# Patient Record
Sex: Male | Born: 1955 | Race: White | Hispanic: No | Marital: Married | Smoking: Never smoker
Health system: Southern US, Community
[De-identification: ages and names within clinical notes are randomized; demographics above are authoritative.]

## PROBLEM LIST (undated history)

## (undated) DIAGNOSIS — I10 Essential (primary) hypertension: Secondary | ICD-10-CM

## (undated) DIAGNOSIS — Z9889 Other specified postprocedural states: Secondary | ICD-10-CM

## (undated) DIAGNOSIS — I341 Nonrheumatic mitral (valve) prolapse: Secondary | ICD-10-CM

## (undated) DIAGNOSIS — I340 Nonrheumatic mitral (valve) insufficiency: Secondary | ICD-10-CM

## (undated) DIAGNOSIS — R0602 Shortness of breath: Secondary | ICD-10-CM

## (undated) DIAGNOSIS — E785 Hyperlipidemia, unspecified: Secondary | ICD-10-CM

## (undated) DIAGNOSIS — F419 Anxiety disorder, unspecified: Secondary | ICD-10-CM

## (undated) DIAGNOSIS — E039 Hypothyroidism, unspecified: Secondary | ICD-10-CM

## (undated) DIAGNOSIS — R011 Cardiac murmur, unspecified: Secondary | ICD-10-CM

## (undated) DIAGNOSIS — F329 Major depressive disorder, single episode, unspecified: Secondary | ICD-10-CM

## (undated) DIAGNOSIS — F32A Depression, unspecified: Secondary | ICD-10-CM

## (undated) DIAGNOSIS — K219 Gastro-esophageal reflux disease without esophagitis: Secondary | ICD-10-CM

## (undated) DIAGNOSIS — I4892 Unspecified atrial flutter: Secondary | ICD-10-CM

## (undated) HISTORY — DX: Unspecified atrial flutter: I48.92

## (undated) HISTORY — PX: CARDIAC CATHETERIZATION: SHX172

## (undated) HISTORY — DX: Nonrheumatic mitral (valve) insufficiency: I34.0

## (undated) HISTORY — PX: TONSILLECTOMY: SUR1361

## (undated) HISTORY — PX: INGUINAL HERNIA REPAIR: SHX194

## (undated) HISTORY — DX: Nonrheumatic mitral (valve) prolapse: I34.1

## (undated) HISTORY — PX: HERNIA REPAIR: SHX51

## (undated) SURGERY — ECHOCARDIOGRAM, TRANSESOPHAGEAL
Anesthesia: Moderate Sedation

---

## 2012-01-03 ENCOUNTER — Other Ambulatory Visit: Payer: Self-pay | Admitting: Cardiovascular Disease

## 2012-01-12 ENCOUNTER — Ambulatory Visit (HOSPITAL_COMMUNITY)
Admission: RE | Admit: 2012-01-12 | Discharge: 2012-01-12 | Disposition: A | Discharge status: Home / Self Care | Payer: BC Managed Care – PPO | Source: Ambulatory Visit | Attending: Cardiovascular Disease | Admitting: Cardiovascular Disease

## 2012-01-12 ENCOUNTER — Encounter (HOSPITAL_COMMUNITY)
Admission: RE | Disposition: A | Discharge status: Home / Self Care | Payer: Self-pay | Source: Ambulatory Visit | Attending: Cardiovascular Disease

## 2012-01-12 ENCOUNTER — Encounter (HOSPITAL_COMMUNITY): Payer: Self-pay | Admitting: *Deleted

## 2012-01-12 DIAGNOSIS — I059 Rheumatic mitral valve disease, unspecified: Secondary | ICD-10-CM | POA: Insufficient documentation

## 2012-01-12 HISTORY — DX: Shortness of breath: R06.02

## 2012-01-12 HISTORY — DX: Hypothyroidism, unspecified: E03.9

## 2012-01-12 HISTORY — DX: Nonrheumatic mitral (valve) prolapse: I34.1

## 2012-01-12 HISTORY — DX: Essential (primary) hypertension: I10

## 2012-01-12 HISTORY — PX: TEE WITHOUT CARDIOVERSION: SHX5443

## 2012-01-12 SURGERY — ECHOCARDIOGRAM, TRANSESOPHAGEAL
Anesthesia: Moderate Sedation

## 2012-01-12 MED ORDER — FENTANYL CITRATE 0.05 MG/ML IJ SOLN
INTRAMUSCULAR | Status: AC
  Filled 2012-01-12: qty 2

## 2012-01-12 MED ORDER — SODIUM CHLORIDE 0.9 % IJ SOLN: 3.0000 mL | Freq: Two times a day (BID) | INTRAMUSCULAR | Status: DC

## 2012-01-12 MED ORDER — MIDAZOLAM HCL 10 MG/2ML IJ SOLN
INTRAMUSCULAR | Status: DC | PRN
  Administered 2012-01-12 (×3): 2 mg via INTRAVENOUS

## 2012-01-12 MED ORDER — SODIUM CHLORIDE 0.45 % IV SOLN
INTRAVENOUS | Status: DC
  Administered 2012-01-12: 12:00:00 via INTRAVENOUS

## 2012-01-12 MED ORDER — FENTANYL CITRATE 0.05 MG/ML IJ SOLN: 250.0000 ug | Freq: Once | INTRAMUSCULAR | Status: DC

## 2012-01-12 MED ORDER — SODIUM CHLORIDE 0.9 % IJ SOLN: 3.0000 mL | INTRAMUSCULAR | Status: DC | PRN

## 2012-01-12 MED ORDER — SODIUM CHLORIDE 0.9 % IV SOLN: 250.0000 mL | INTRAVENOUS | Status: DC | PRN

## 2012-01-12 MED ORDER — FENTANYL CITRATE 0.05 MG/ML IJ SOLN
INTRAMUSCULAR | Status: DC | PRN
  Administered 2012-01-12 (×2): 25 ug via INTRAVENOUS

## 2012-01-12 MED ORDER — BENZOCAINE 20 % MT SOLN: 1.0000 "application " | OROMUCOSAL | Status: DC | PRN

## 2012-01-12 MED ORDER — MIDAZOLAM HCL 10 MG/2ML IJ SOLN: 10.0000 mg | Freq: Once | INTRAMUSCULAR | Status: DC

## 2012-01-12 MED ORDER — BUTAMBEN-TETRACAINE-BENZOCAINE 2-2-14 % EX AERO
INHALATION_SPRAY | CUTANEOUS | Status: DC | PRN
  Administered 2012-01-12: 2 via TOPICAL

## 2012-01-12 MED ORDER — MIDAZOLAM HCL 10 MG/2ML IJ SOLN
INTRAMUSCULAR | Status: AC
  Filled 2012-01-12: qty 2

## 2012-01-12 NOTE — CV Procedure (Signed)
INDICATIONS: mitral insufficiency  PROCEDURE:   Informed consent was obtained prior to the procedure. The risks, benefits and alternatives for the procedure were discussed and the patient comprehended these risks.  Risks include, but are not limited to, cough, sore throat, vomiting, nausea, somnolence, esophageal and stomach trauma or perforation, bleeding, low blood pressure, aspiration, pneumonia, infection, trauma to the teeth and death.    After a procedural time-out, the oropharynx was anesthetized with 20% benzocaine spray. The patient was given 7 mg versed and 50 mcg fentanyl for moderate sedation.   The transesophageal probe was inserted in the esophagus and stomach without difficulty and multiple views were obtained.  The patient was kept under observation until the patient left the procedure room.  The patient left the procedure room in stable condition.   Agitated microbubble saline contrast was administered.  COMPLICATIONS:    There were no immediate complications.  FINDINGS:  Normal left ventricular systolic function. Moderately dilated left atrium. 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 Moderate to severe mitral insufficiency with late systolic flow reversal in the right pulmonary veins. Otherwise normal study.  RECOMMENDATIONS:   Consider surgical repair of the mitral valve.  Time Spent Directly with the Patient:  45  minutes   Nelva Hauk 01/12/2012, 12:20 PM

## 2012-01-12 NOTE — Progress Notes (Signed)
  Echocardiogram Echocardiogram Transesophageal has been performed.  Matthew Mclean [place] 01/12/2012, 1:08 PM

## 2012-01-12 NOTE — H&P (Signed)
Date of Initial H&P: 01/03/2012  History reviewed, patient examined, no change in status, stable for TEE. TEE for MVP and MR preop. Thurmon Fair, MD, [facility] [phone] office [phone] pager 01/12/2012 1130h.

## 2012-01-12 NOTE — Discharge Instructions (Signed)
Transesophageal Echocardiography A transesophageal echocardiogram (TEE) is a special type of test that produces images of the heart by sound waves (echocardiogram). This type of echocardiogram can obtain better images of the heart than a standard echocardiogram. A TEE is done by passing a flexible tube down the esophagus. The heart is located in front of the esophagus. Because the heart and esophagus are close to one another, your caregiver can take very clear, detailed pictures of the heart via ultrasound waves. WHY HAVE A TEE? Your caregiver may need more information based on your medical condition. A TEE is usually performed due to the following:  Your caregiver needs more information based on standard echocardiogram findings.   If you had a stroke, this might have happened because a clot formed in your heart. A TEE can visualize different areas of the heart and check for clots.   To check valve anatomy and function. Your caregiver will especially look at the mitral valve.   To check for redness, soreness, and swelling (inflammation) on the inside lining of the heart (endocarditis).   To evaluate the dividing wall (septum) of the heart and presence of a hole that did not close after birth (patent foramen ovale, PFO).   To help diagnose a tear in the wall of the aorta (aortic dissection).   During cardiac valve surgery, a TEE probe is placed. This allows the surgeon to assess the valve repair before closing the chest.  LET YOUR CAREGIVER KNOW ABOUT:   Swallowing difficulties.   An esophageal obstruction.   Use of aspirin or antiplatelet therapy.  RISKS AND COMPLICATIONS  Though extremely rare, an esophageal tear (rupture) is a potential complication. BEFORE THE PROCEDURE   Arrive at least 1 hour before the procedure or as told by your caregiver.   Do not eat or drink for 6 hours before the procedure or as told by your caregiver.   An intravenous (IV) access tube will be started in  the arm.  PROCEDURE   A medicine to help you relax (sedative) will be given through the IV.   A medicine that numbs the area (local anesthetic) may be sprayed to the back of the throat.   Your blood pressure, heart rate, and breathing (vital signs) will be monitored during the procedure.   The TEE probe is a long, flexible tube. It is about the width of an adult male's index finger. The tip of the probe is placed into the back of the mouth and you will be asked to swallow. This helps to pass the tip of the probe into the esophagus. Once the tip of the probe is in the correct area, your caregiver can take pictures of the heart.   A TEE is usually not a painful procedure. You may feel the probe press against the back of the throat. The probe does not enter the trachea and does not affect your breathing.   Your time spent at the hospital is usually less than 2 hours.  AFTER THE PROCEDURE   You will be in bed, resting until you have fully returned to consciousness.   When you first awaken, your throat may feel slightly sore and will probably still feel numb. This will improve slowly over time.   You will not be allowed to eat or drink until it is clear that numbness has improved.   Once you have been able to drink, urinate, and sit on the edge of the bed without feeling sick to your stomach (nauseous)   or dizzy, you may be cleared to dress and go home.   Do not drive yourself home. You have had medications that can continue to make you feel drowsy and can impair your reflexes.   You should have a friend or family member with you for the next 24 hours after your examination.  Obtaining the test results It is your responsibility to obtain your test results. Ask the lab or department performing the test when and how you will get your results. SEEK IMMEDIATE MEDICAL CARE IF:   There is chest pain.   You have a hard time breathing or have shortness of breath.   You cough or throw up (vomit)  blood.  MAKE SURE YOU:   Understand these instructions.   Will watch this condition.   Will get help right away if you is not doing well or gets worse.  Document Released: 12/24/2002 Document Revised: 09/22/2011 Document Reviewed: 03/17/2009 [employer] Patient Information 2012 [employer]. 

## 2012-01-12 NOTE — Op Note (Signed)
Fooks,Kota S Male, 56 y.o., [birth year]  MRN: [id number]     TEE REPORT  INDICATIONS:  mitral insufficiency  PROCEDURE:  Informed consent was obtained prior to the procedure. The risks, benefits and alternatives for the procedure were discussed and the patient comprehended these risks. Risks include, but are not limited to, cough, sore throat, vomiting, nausea, somnolence, esophageal and stomach trauma or perforation, bleeding, low blood pressure, aspiration, pneumonia, infection, trauma to the teeth and death.  After a procedural time-out, the oropharynx was anesthetized with 20% benzocaine spray. The patient was given 7 mg versed and 50 mcg fentanyl for moderate sedation. The transesophageal probe was inserted in the esophagus and stomach without difficulty and multiple views were obtained. The patient was kept under observation until the patient left the procedure room. The patient left the procedure room in stable condition.  Agitated microbubble saline contrast was administered.  COMPLICATIONS:  There were no immediate complications.  FINDINGS:  Normal left ventricular systolic function.  Moderately dilated left atrium.  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  Moderate to severe mitral insufficiency with late systolic flow reversal in the right pulmonary veins.  Otherwise normal study.  RECOMMENDATIONS:  Consider surgical repair of the mitral valve.  Time Spent Directly with the Patient:  45  minutes  Claron Rosencrans  01/12/2012, 12:20 PM

## 2012-01-16 ENCOUNTER — Encounter (HOSPITAL_COMMUNITY): Payer: Self-pay | Admitting: Cardiovascular Disease

## 2012-01-18 ENCOUNTER — Encounter: Payer: Self-pay | Admitting: Thoracic Surgery (Cardiothoracic Vascular Surgery)

## 2012-01-20 ENCOUNTER — Encounter: Payer: Self-pay | Admitting: Thoracic Surgery (Cardiothoracic Vascular Surgery)

## 2012-01-20 DIAGNOSIS — I341 Nonrheumatic mitral (valve) prolapse: Secondary | ICD-10-CM | POA: Insufficient documentation

## 2012-01-20 DIAGNOSIS — E039 Hypothyroidism, unspecified: Secondary | ICD-10-CM

## 2012-01-20 DIAGNOSIS — R0602 Shortness of breath: Secondary | ICD-10-CM

## 2012-01-20 DIAGNOSIS — I10 Essential (primary) hypertension: Secondary | ICD-10-CM

## 2012-01-23 ENCOUNTER — Institutional Professional Consult (permissible substitution) (INDEPENDENT_AMBULATORY_CARE_PROVIDER_SITE_OTHER): Payer: BC Managed Care – PPO | Admitting: Thoracic Surgery (Cardiothoracic Vascular Surgery)

## 2012-01-23 ENCOUNTER — Other Ambulatory Visit: Payer: Self-pay

## 2012-01-23 ENCOUNTER — Encounter (HOSPITAL_COMMUNITY): Payer: Self-pay | Admitting: Pharmacy Technician

## 2012-01-23 ENCOUNTER — Other Ambulatory Visit: Payer: Self-pay | Admitting: Thoracic Surgery (Cardiothoracic Vascular Surgery)

## 2012-01-23 ENCOUNTER — Encounter: Payer: Self-pay | Admitting: Thoracic Surgery (Cardiothoracic Vascular Surgery)

## 2012-01-23 VITALS — BP 121/74 | HR 56 | Temp 98.2°F | Resp 16 | Ht 71.5 in | Wt 201.0 lb

## 2012-01-23 DIAGNOSIS — I340 Nonrheumatic mitral (valve) insufficiency: Secondary | ICD-10-CM

## 2012-01-23 DIAGNOSIS — I341 Nonrheumatic mitral (valve) prolapse: Secondary | ICD-10-CM

## 2012-01-23 DIAGNOSIS — I059 Rheumatic mitral valve disease, unspecified: Secondary | ICD-10-CM

## 2012-01-23 NOTE — Progress Notes (Signed)
[address]            [name] [address]          [phone]     CARDIOTHORACIC SURGERY CONSULTATION REPORT  PCP is [facility], MD, MD Referring Provider is Nanetta Batty, MD  Chief Complaint  Patient presents with  . Mitral Valve Prolapse    Referral from Dr Allyson Sabal for poss MV repair, TEE 01/12/12  . 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 The patient states that his symptoms have been vague in onset and severity, but he has definitely noticed a tendency towards worsening fatigue. He used to exercise quite regularly and he had to quit running do to severe fatigue and exertional shortness of breath. He was noted to have a heart murmur on physical exam by his primary care physician and an echocardiogram was performed demonstrating mitral valve prolapse with mitral regurgitation. The patient was referred to Dr. Allyson Sabal and subsequently underwent transesophageal echocardiogram confirming the presence of mitral valve prolapse with severe mitral regurgitation. The patient has now been referred to consider elective surgical intervention.  Past Medical History  Diagnosis Date  . Hypertension   . Shortness of breath     on exertion  . Hypothyroidism   . MVP (mitral valve prolapse)   . 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 Hernia repair     inguinal x 2  . Tee without cardioversion 01/12/2012    Procedure: TRANSESOPHAGEAL ECHOCARDIOGRAM (TEE);  Surgeon: Thurmon Fair, MD;  Location: [facility] ENDOSCOPY;  Service: Cardiovascular;  Laterality: N/A;  3d echo    Family History  Problem Relation Age of Onset  . Cancer Paternal Grandmother     Social History History  Substance Use Topics  . Smoking status: Never Smoker   . Smokeless  tobacco: Never Used  . Alcohol Use: No    Current Outpatient Prescriptions  Medication Sig Dispense Refill  . aspirin 325 MG tablet Take 325 mg by mouth daily.      . chlorpheniramine (CHLOR-TRIMETON) 4 MG tablet Take 4 mg by mouth 2 (two) times daily as needed.      . DULoxetine (CYMBALTA) 60 MG capsule Take 60 mg by mouth daily.      Marland Kitchen ibuprofen (ADVIL,MOTRIN) 200 MG tablet Take 400 mg by mouth 2 (two) times daily.      Marland Kitchen levothyroxine (SYNTHROID, LEVOTHROID) 125 MCG tablet Take 125 mcg by mouth daily.      . nebivolol (BYSTOLIC) 5 MG tablet Take 5 mg by mouth daily.      . valsartan (DIOVAN) 320 MG tablet Take 320 mg by mouth daily.        Allergies  Allergen Reactions  . Penicillins Other (See Comments)    Unknown reaction.  Mother told him he was allergic.    Review of Systems:  General:  normal appetite, decreased energy   Respiratory:  no cough, no wheezing, no hemoptysis, no pain with inspiration or cough, + mild exertional shortness of breath  Cardiac:  + intermittent chest pain or tightness typically with stress or exertion, + exertional SOB, no resting SOB, no PND, no orthopnea, no LE edema, no palpitations, no syncope  GI:   no difficulty swallowing, no hematochezia, no hematemesis, no melena, + constipation, no diarrhea   GU:   no dysuria, no urgency, no frequency   Musculoskeletal: no arthritis, mild arthralgia both knees and recently in both feet  Vascular:  no pain suggestive of claudication   Neuro:   no symptoms suggestive of TIA's, no seizures, no headaches, no peripheral neuropathy   Endocrine:  Negative   HEENT:  no loose teeth or painful teeth, goes to his dentist regularly, no recent vision changes  Psych:   + anxiety, no depression    Physical Exam:   BP 121/74  Pulse 56  Temp 98.2 F (36.8 C)  Resp 16  Ht 5' 11.5" (1.816 m)  Wt 201 lb (91.173 kg)  BMI 27.64 kg/m2  SpO2 98%  General:    well-appearing  HEENT:  Unremarkable   Neck:   no JVD, no  bruits, no adenopathy   Chest:   clear to auscultation, symmetrical breath sounds, no wheezes, no rhonchi   CV:   RRR, grade III/VI systolic murmur   Abdomen:  soft, non-tender, no masses   Extremities:  warm, well-perfused, pulses palpable  Rectal/GU  Deferred  Neuro:   Grossly non-focal and symmetrical throughout  Skin:   Clean and dry, no rashes, no breakdown   Diagnostic Tests:  Transesophageal Echocardiography  Patient: Matthew Mclean, [place] MR #: [id number] Study Date: 01/12/2012 ------------------------------------------------------------ LV EF: 55% - 60% ------------------------------------------------------------ Indications: Mitral regurgitation 424.0. ------------------------------------------------------------ Study Conclusions - Left ventricle: The cavity size was at the upper limits of normal. Wall thickness was normal. Systolic function was normal. The estimated ejection fraction was in the range of 55% to 60%. Wall motion was normal; there were no regional wall motion abnormalities. - Aortic valve: No evidence of vegetation. - Mitral valve: Mildly dilated annulus. Moderately thickened leaflets . Moderate myxomatous degeneration. Severe, holosystolicprolapse, involving the medial and middle scallop of the posterior leaflet. The maximum prolapse dimension was 10mm. Moderate to severe regurgitation directed anteriorly. - Left atrium: The atrium was dilated. No evidence of thrombus in the atrial cavity or appendage. No evidence of thrombus in the atrial cavity or appendage. - Right atrium: No evidence of thrombus in the atrial cavity or appendage. No evidence of thrombus in the atrial cavity or appendage. - Atrial septum: No defect or patent foramen ovale was identified. Echo contrast study showed no right-to-left atrial level shunt, following an increase in RA pressure induced by provocative maneuvers. Echo contrast study showed no right-to-left atrial level shunt,  at baseline or with provocation. - Tricuspid valve: No evidence of vegetation. - Pulmonic valve: No evidence of vegetation. ------------------------------------------------------------ Left ventricle: The cavity size was at the upper limits of normal. Wall thickness was normal. Systolic function was normal. The estimated ejection fraction was in the range of 55% to 60%. Wall motion was normal; there were no regional wall motion abnormalities. ------------------------------------------------------------ Aortic valve: Structurally normal valve. Trileaflet; normal thickness leaflets. Cusp separation was normal. No evidence of vegetation. Doppler: No regurgitation. ------------------------------------------------------------ Aorta: The aorta was normal, not dilated, and non-diseased. There was no atheroma. There was no evidence for dissection. Aortic root: The aortic root was not dilated. Ascending aorta: The ascending aorta was normal in size. Aortic arch: The aortic arch was normal in size. Descending aorta: The descending aorta was normal in size. ------------------------------------------------------------ Mitral valve: Mildly dilated annulus. Moderately thickened leaflets . Moderate myxomatous degeneration. Leaflet separation was normal. Severe, holosystolicprolapse, involving the medial and middle scallop of  the posterior leaflet. The maximum prolapse dimension was 10mm. Doppler: Moderate to severe regurgitation directed anteriorly. ------------------------------------------------------------ Left atrium: The atrium was dilated. No evidence of thrombus in the atrial cavity or appendage. No evidence of thrombus in the atrial cavity or appendage. The appendage was morphologically a left appendage, multilobulated, and of normal size. Emptying velocity was normal. ------------------------------------------------------------ Atrial septum: No defect or patent foramen ovale  was identified. Echo contrast study showed no right-to-left atrial level shunt, following an increase in RA pressure induced by provocative maneuvers. Echo contrast study showed no right-to-left atrial level shunt, at baseline or with provocation. ------------------------------------------------------------ Right ventricle: The cavity size was normal. Wall thickness was normal. Systolic function was normal. ------------------------------------------------------------ Pulmonic valve: Structurally normal valve. Cusp separation was normal. No evidence of vegetation. ------------------------------------------------------------ Tricuspid valve: Structurally normal valve. Leaflet separation was normal. No evidence of vegetation. Doppler: No regurgitation. ------------------------------------------------------------ Pulmonary artery: The main pulmonary artery was normal-sized. ------------------------------------------------------------ Right atrium: The atrium was normal in size. No evidence of thrombus in the atrial cavity or appendage. No evidence of thrombus in the atrial cavity or appendage. The appendage was morphologically a right appendage. ------------------------------------------------------------ Pericardium: There was no pericardial effusion. ------------------------------------------------------------ Post procedure conclusions Ascending Aorta: - The aorta was normal, not dilated, and non-diseased. ------------------------------------------------------------ 2D measurements Normal Doppler measurements Normal Mitral valve Mitral valve Annulus ML 43.34 mm ------ Regurg 32.6 cm/s ------ diam, D alias vel, PISA Regurg 12.8 mm ------ radius, PISA Max regurg 551 cm/s ------ vel Regurg VTI 131 cm ------ ERO, PISA 0.61 cm^2 ------ Regurg 79.8 ml ------ vol, PISA ------------------------------------------------------------ Amended  Croitoru,  Mihai [id number]    Impression:  Mitral valve prolapse with severe, symptomatic mitral regurgitation. The patient has normal left ventricular systolic function and is otherwise healthy. Based upon review of his recent transesophageal echocardiogram, there is a very high likelihood that his valve should be repairable with excellent durable result. The patient appears to be a very good candidate for minimally invasive approach for surgery although he has not yet had heart catheterization performed.   Plan:  The rationale for elective mitral valve repair surgery has been explained, including a comparison between surgery and continued medical therapy with close follow-up.  The likelihood of successful and durable valve repair has been discussed with particular reference to the findings of their recent echocardiogram.  Based upon these findings and previous experience, I have quoted them a greater than 98 percent likelihood of successful valve repair.  In the unlikely event that their valve cannot be successfully repaired, we discussed the possibility of replacing the mitral valve using a mechanical prosthesis with the attendant need for long-term anticoagulation versus the alternative of replacing it using a bioprosthetic tissue valve with its potential for late structural valve deterioration and failure, depending upon the patient's longevity.  Alternative surgical approaches have been discussed, including a comparison between conventional sternotomy and minimally-invasive techniques.  The relative risks and benefits of each have been reviewed as they pertain to the patient's specific circumstances, and all of their questions have been addressed.  We tentatively plan to proceed with surgery on Tuesday, May 7. The patient will contact Dr. Hazle Coca office to schedule elective left and right heart catheterization within the next few weeks. We will ask that the descending thoracic and abdominal  aorta be imaged at the time of catheterization to rule out the presence of aortoiliac occlusive disease.     [name]. Cornelius Moras, MD 01/23/2012 11:26 AM

## 2012-02-01 ENCOUNTER — Other Ambulatory Visit: Payer: Self-pay | Admitting: Cardiovascular Disease

## 2012-02-05 MED ORDER — METOPROLOL TARTRATE 12.5 MG HALF TABLET
12.5000 mg | ORAL_TABLET | Freq: Once | ORAL | Status: DC
  Filled 2012-02-05: qty 1

## 2012-02-05 MED ORDER — VANCOMYCIN HCL 1000 MG IV SOLR
1500.0000 mg | INTRAVENOUS | Status: DC
  Filled 2012-02-05: qty 1500

## 2012-02-05 MED ORDER — EPINEPHRINE HCL 1 MG/ML IJ SOLN
0.5000 ug/min | INTRAVENOUS | Status: DC
  Filled 2012-02-05: qty 4

## 2012-02-05 MED ORDER — PHENYLEPHRINE HCL 10 MG/ML IJ SOLN
30.0000 ug/min | INTRAVENOUS | Status: DC
  Filled 2012-02-05: qty 2

## 2012-02-05 MED ORDER — POTASSIUM CHLORIDE 2 MEQ/ML IV SOLN
80.0000 meq | INTRAVENOUS | Status: DC
  Filled 2012-02-05: qty 40

## 2012-02-05 MED ORDER — MOXIFLOXACIN HCL IN NACL 400 MG/250ML IV SOLN
400.0000 mg | INTRAVENOUS | Status: DC
  Filled 2012-02-05: qty 250

## 2012-02-05 MED ORDER — TRANEXAMIC ACID (OHS) BOLUS VIA INFUSION
15.0000 mg/kg | INTRAVENOUS | Status: DC
  Filled 2012-02-05: qty 1368

## 2012-02-05 MED ORDER — DOPAMINE-DEXTROSE 3.2-5 MG/ML-% IV SOLN: 2.0000 ug/kg/min | INTRAVENOUS | Status: DC

## 2012-02-05 MED ORDER — NITROGLYCERIN IN D5W 200-5 MCG/ML-% IV SOLN: 2.0000 ug/min | INTRAVENOUS | Status: DC

## 2012-02-05 MED ORDER — MAGNESIUM SULFATE 50 % IJ SOLN
40.0000 meq | INTRAMUSCULAR | Status: DC
  Filled 2012-02-05: qty 10

## 2012-02-05 MED ORDER — TRANEXAMIC ACID 100 MG/ML IV SOLN
1.5000 mg/kg/h | INTRAVENOUS | Status: DC
  Filled 2012-02-05: qty 25

## 2012-02-05 MED ORDER — CHLORHEXIDINE GLUCONATE 4 % EX LIQD
30.0000 mL | CUTANEOUS | Status: DC
  Filled 2012-02-05: qty 30

## 2012-02-05 MED ORDER — SODIUM CHLORIDE 0.9 % IV SOLN
INTRAVENOUS | Status: DC
  Filled 2012-02-05: qty 1

## 2012-02-05 MED ORDER — TRANEXAMIC ACID (OHS) PUMP PRIME SOLUTION
2.0000 mg/kg | INTRAVENOUS | Status: DC
  Filled 2012-02-05: qty 1.82

## 2012-02-05 MED ORDER — SODIUM BICARBONATE 8.4 % IV SOLN
INTRAVENOUS | Status: DC
  Filled 2012-02-05: qty 2.5

## 2012-02-05 MED ORDER — SODIUM CHLORIDE 0.9 % IV SOLN
0.1000 ug/kg/h | INTRAVENOUS | Status: DC
  Filled 2012-02-05: qty 4

## 2012-02-06 ENCOUNTER — Encounter (HOSPITAL_COMMUNITY)
Admission: RE | Disposition: A | Discharge status: Home / Self Care | Payer: Self-pay | Source: Ambulatory Visit | Attending: Cardiovascular Disease

## 2012-02-06 ENCOUNTER — Encounter (HOSPITAL_COMMUNITY): Payer: Self-pay | Admitting: Respiratory Therapy

## 2012-02-06 ENCOUNTER — Ambulatory Visit (HOSPITAL_COMMUNITY)
Admission: RE | Admit: 2012-02-06 | Discharge: 2012-02-06 | Disposition: A | Discharge status: Home / Self Care | Payer: BC Managed Care – PPO | Source: Ambulatory Visit | Attending: Cardiovascular Disease | Admitting: Cardiovascular Disease

## 2012-02-06 DIAGNOSIS — I10 Essential (primary) hypertension: Secondary | ICD-10-CM | POA: Insufficient documentation

## 2012-02-06 DIAGNOSIS — I059 Rheumatic mitral valve disease, unspecified: Secondary | ICD-10-CM | POA: Insufficient documentation

## 2012-02-06 DIAGNOSIS — E785 Hyperlipidemia, unspecified: Secondary | ICD-10-CM | POA: Insufficient documentation

## 2012-02-06 HISTORY — PX: LEFT AND RIGHT HEART CATHETERIZATION WITH CORONARY ANGIOGRAM: SHX5449

## 2012-02-06 LAB — POCT I-STAT 3, ART BLOOD GAS (G3+)
Acid-base deficit: 2 mmol/L (ref 0.0–2.0)
Acid-base deficit: 2 mmol/L (ref 0.0–2.0)
Bicarbonate: 22.1 mEq/L (ref 20.0–24.0)
O2 Saturation: 98 %
TCO2: 23 mmol/L (ref 0–100)
pH, Arterial: 7.362 (ref 7.350–7.450)

## 2012-02-06 SURGERY — LEFT AND RIGHT HEART CATHETERIZATION WITH CORONARY ANGIOGRAM
Anesthesia: LOCAL

## 2012-02-06 MED ORDER — DIAZEPAM 5 MG PO TABS
ORAL_TABLET | ORAL | Status: AC
  Filled 2012-02-06: qty 1

## 2012-02-06 MED ORDER — DIAZEPAM 5 MG PO TABS: 5.0000 mg | ORAL_TABLET | ORAL | Status: DC

## 2012-02-06 MED ORDER — LIDOCAINE HCL (PF) 1 % IJ SOLN
INTRAMUSCULAR | Status: AC
  Filled 2012-02-06: qty 30

## 2012-02-06 MED ORDER — ACETAMINOPHEN 325 MG PO TABS: 650.0000 mg | ORAL_TABLET | ORAL | Status: DC | PRN

## 2012-02-06 MED ORDER — ONDANSETRON HCL 4 MG/2ML IJ SOLN: 4.0000 mg | Freq: Four times a day (QID) | INTRAMUSCULAR | Status: DC | PRN

## 2012-02-06 MED ORDER — HEPARIN (PORCINE) IN NACL 2-0.9 UNIT/ML-% IJ SOLN
INTRAMUSCULAR | Status: AC
  Filled 2012-02-06: qty 2000

## 2012-02-06 MED ORDER — NITROGLYCERIN 0.2 MG/ML ON CALL CATH LAB
INTRAVENOUS | Status: AC
  Filled 2012-02-06: qty 1

## 2012-02-06 MED ORDER — SODIUM CHLORIDE 0.9 % IJ SOLN: 3.0000 mL | INTRAMUSCULAR | Status: DC | PRN

## 2012-02-06 MED ORDER — SODIUM CHLORIDE 0.9 % IV SOLN: INTRAVENOUS | Status: AC

## 2012-02-06 MED ORDER — SODIUM CHLORIDE 0.9 % IV SOLN
INTRAVENOUS | Status: DC
  Administered 2012-02-06: 14:00:00 via INTRAVENOUS

## 2012-02-06 NOTE — Progress Notes (Signed)
UP AND WALKED AND TOL WELL LEFT GROIN STABLE NO BLEEDING OR HEMATOMA 

## 2012-02-06 NOTE — Op Note (Signed)
Matthew Mclean is a 55 y.o. male    [id number] LOCATION:  FACILITY: [facility]  PHYSICIAN: Nanetta Batty, M.D. 1956/01/08   DATE OF PROCEDURE:  02/06/2012  DATE OF DISCHARGE:  [facility]  CARDIAC CATHETERIZATION     History obtained from chart review. Mr. 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 He was referred for evaluation of mitral regurgitation. Comes in with hypertension and mild hyperlipidemia. 2-D echo revealed severe MR which was confirmed by 3 TEE performed in the hospital. He presents now for a left heart cath to define his coronary anatomy prior to anticipated invasive mitral valve repair to be performed by Dr. Ashley Mariner.   PROCEDURE DESCRIPTION:    The patient was brought to the second floor  [facility] cath lab in the postabsorptive state. He was not  premedicated . His left groin was prepped and shaved in usual sterile fashion. Xylocaine 1% was used  for local anesthesia. A 5 French sheath was inserted into the left common femoral  artery using standard Seldinger technique. A 7 French sheath was inserted into the left common femoral vein. A balloon tipped dilution Swan-Ganz catheter was in advanced to the chambers obtaining sequential pressures, and Fick and thermodilution cardiac outputs. 5 French right and left Judkins diagnostic catheters along with a 5 French pigtail catheter were used for selective coronary angiography, left ventriculography and thoracic and abdominal aortography. Visipaque was used for the entirety of the case. Retrograde aortic, left ventricular and pullback pressures were recorded.  HEMODYNAMICS:    AO SYSTOLIC/AO DIASTOLIC: 114/74   LV SYSTOLIC/LV DIASTOLIC: 119/15  RA pressure: 16/13  RV pressure: 31/11  Pulmonary artery pressure: 28/17  Pulmonary capillary wedge pressure: A wave 20 V wave 21 mean 17  Cardiac  output by Fick was 4.6 L per minute, and bicarbonate dilution 4 L per minute  ANGIOGRAPHIC RESULTS:   1. Left main; normal  2. LAD; normal 3. Left circumflex; normal.  4. Right coronary artery; dominant and normal 5. Left ventriculography; RAO left ventriculogram was performed using  25 mL of Visipaque dye at 12 mL/second. The overall LVEF estimated  60 % Without wall motion abnormalities  Thoracic and abdominal aortogram: Normal, with normal renal arteries are normal iliac bifurcation  IMPRESSION:Mr. Guevarra has normal coronary arteries, normal left ventricular function, mildly elevated right-sided pressures and normal thoracic aorta. The sheaths were removed and pressure was held on the groin to achieve hemostasis. The patient left the lab in stable condition. He was gently hydrated, discharged home in 4-5 hours and will see back in the office later this week in followup. Dr.Cub Cornelius Moras  Was notified of these results.  Runell Gess MD, [facility] 02/06/2012 3:49 PM

## 2012-02-06 NOTE — H&P (Signed)
H & P will be scanned in.  Pt was reexamined and existing H & P reviewed. No changes found.  Runell Gess, MD [facility] 02/06/2012 3:01 PM

## 2012-02-06 NOTE — Discharge Instructions (Signed)

## 2012-02-13 ENCOUNTER — Ambulatory Visit (INDEPENDENT_AMBULATORY_CARE_PROVIDER_SITE_OTHER): Payer: BC Managed Care – PPO | Admitting: Thoracic Surgery (Cardiothoracic Vascular Surgery)

## 2012-02-13 ENCOUNTER — Encounter: Payer: Self-pay | Admitting: Thoracic Surgery (Cardiothoracic Vascular Surgery)

## 2012-02-13 VITALS — BP 131/68 | HR 80 | Resp 16 | Ht 71.5 in | Wt 195.0 lb

## 2012-02-13 DIAGNOSIS — I059 Rheumatic mitral valve disease, unspecified: Secondary | ICD-10-CM | POA: Insufficient documentation

## 2012-02-13 MED ORDER — AMIODARONE HCL 200 MG PO TABS: 200.0000 mg | ORAL_TABLET | Freq: Two times a day (BID) | ORAL | Status: DC

## 2012-02-13 NOTE — Patient Instructions (Signed)
Stop aspirin and start amiodarone

## 2012-02-13 NOTE — Progress Notes (Signed)
[address]            Matthew Mclean [address]          [phone]     CARDIOTHORACIC SURGERY OFFICE NOTE  Referring Provider is Runell Gess, MD PCP is [facility], MD, MD   HPI:  Patient returns for followup of mitral regurgitation with tentatively plans to proceed with mitral valve repair next week. He underwent cardiac catheterization on 02/06/2012. He returns to the office today to review his cath results and further discuss his upcoming surgery.  He reports no new problems or complaints. His wife could not accompany him to the office today.   Current Outpatient Prescriptions  Medication Sig Dispense Refill  . chlorpheniramine (CHLOR-TRIMETON) 4 MG tablet Take 4 mg by mouth 2 (two) times daily as needed.      Marland Kitchen ibuprofen (ADVIL,MOTRIN) 200 MG tablet Take 200 mg by mouth 2 (two) times daily.       . nebivolol (BYSTOLIC) 5 MG tablet Take 5 mg by mouth daily.      . rosuvastatin (CRESTOR) 10 MG tablet Take 10 mg by mouth daily.      . valsartan (DIOVAN) 320 MG tablet Take 320 mg by mouth daily.      Marland Kitchen amiodarone (PACERONE) 200 MG tablet Take 1 tablet (200 mg total) by mouth 2 (two) times daily. Begin 7 days prior to surgery.  14 tablet  0  . levothyroxine (SYNTHROID, LEVOTHROID) 125 MCG tablet Take 125 mcg by mouth daily.          Physical Exam:   BP 131/68  Pulse 80  Resp 16  Ht 5' 11.5" (1.816 m)  Wt 195 lb (88.451 kg)  BMI 26.82 kg/m2  SpO2 96%  General:  Well-appearing  Chest:   Clear to auscultation  CV:   Regular rate and rhythm with prominent systolic murmur  Incisions:  n/a  Abdomen:  Soft and nontender  Extremities:  Warm and well-perfused    Diagnostic Tests:  CARDIAC CATHETERIZATION   HEMODYNAMICS:  AO SYSTOLIC/AO DIASTOLIC: 114/74  LV SYSTOLIC/LV DIASTOLIC: 119/15  RA pressure: 16/13  RV pressure: 31/11  Pulmonary artery pressure: 28/17  Pulmonary capillary wedge pressure: A wave 20 V wave 21 mean 17    Cardiac output by Fick was 4.6 L per minute, and bicarbonate dilution 4 L per minute  ANGIOGRAPHIC RESULTS:  1. Left main; normal  2. LAD; normal  3. Left circumflex; normal.  4. Right coronary artery; dominant and normal  5. Left ventriculography; RAO left ventriculogram was performed using  25 mL of Visipaque dye at 12 mL/second. The overall LVEF estimated  60 % Without wall motion abnormalities  Thoracic and abdominal aortogram: Normal, with normal renal arteries are normal iliac bifurcation  IMPRESSION:Matthew Mclean has normal coronary arteries, normal left ventricular function, mildly elevated right-sided pressures and normal thoracic aorta. The sheaths were removed and pressure was held on the groin to achieve hemostasis. The patient left the lab in stable condition. He was gently hydrated, discharged home in 4-5 hours and will see back in the office later this week in followup. Dr.Cub Cornelius Mclean Was notified of these results.  Runell Gess MD, [facility]  02/06/2012  3:49 PM       Impression:  The patient has mitral valve prolapse with severe mitral regurgitation. Cardiac catheterization demonstrates normal coronary artery anatomy with no significant coronary artery  disease. He appears to be an excellent candidate for minimally invasive approach for surgery. We plan to proceed as originally scheduled on Tuesday, May 7.   Plan:  I again reviewed the indications, risks, and potential benefits of surgery with the patient here in the office today. I started him on amiodarone 200 mg by mouth twice daily to take between now and the time of surgery to decrease his risk of perioperative atrial fibrillation. He has been instructed to stop taking aspirin. He understands and accepts all associated risks of surgery and desires to proceed as originally planned.  We spent in excess of 40 minutes discussing expectations for his recovery.   Matthew Decent. Cornelius Moras, MD 02/13/2012 6:46 PM

## 2012-02-14 NOTE — H&P (Signed)
CARDIOTHORACIC SURGERY HISTORY AND PHYSICAL EXAM  PCP is [facility], MD, MD Referring Provider is Nanetta Batty, MD    Chief Complaint   Patient presents with   .  Mitral Valve Prolapse       Referral from Dr Allyson Sabal for poss MV repair, TEE 01/12/12   .  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 The patient states that his symptoms have been vague in onset and severity, but he has definitely noticed a tendency towards worsening fatigue. He used to exercise quite regularly and he had to quit running do to severe fatigue and exertional shortness of breath. He was noted to have a heart murmur on physical exam by his primary care physician and an echocardiogram was performed demonstrating mitral valve prolapse with mitral regurgitation. The patient was referred to Dr. Allyson Sabal and subsequently underwent transesophageal echocardiogram confirming the presence of mitral valve prolapse with severe mitral regurgitation. The patient was referred to consider elective surgical intervention.        Past Medical History  Diagnosis Date  . Hypertension   . Shortness of breath     on exertion  . Hypothyroidism   . MVP (mitral valve prolapse)   . 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 Hernia repair     inguinal x 2  . Tee without cardioversion 01/12/2012    Procedure: TRANSESOPHAGEAL ECHOCARDIOGRAM (TEE);  Surgeon: Thurmon Fair, MD;  Location: [facility];  Service: Cardiovascular;  Laterality: N/A;  3d echo    Family History  Problem Relation Age of Onset  . Cancer Paternal Grandmother     Social History History  Substance Use Topics  . Smoking status: Never Smoker   . Smokeless tobacco: Never Used  . Alcohol Use: No    No current facility-administered  medications for this encounter.   Current Outpatient Prescriptions  Medication Sig Dispense Refill  . chlorpheniramine (CHLOR-TRIMETON) 4 MG tablet Take 4 mg by mouth 2 (two) times daily as needed.      Marland Kitchen ibuprofen (ADVIL,MOTRIN) 200 MG tablet Take 200 mg by mouth 2 (two) times daily.       Marland Kitchen levothyroxine (SYNTHROID, LEVOTHROID) 125 MCG tablet Take 125 mcg by mouth daily.      . nebivolol (BYSTOLIC) 5 MG tablet Take 5 mg by mouth daily.      . valsartan (DIOVAN) 320 MG tablet Take 320 mg by mouth daily.      Marland Kitchen amiodarone (PACERONE) 200 MG tablet Take 1 tablet (200 mg total) by mouth 2 (two) times daily. Begin 7 days prior to surgery.  14 tablet  0  . rosuvastatin (CRESTOR) 10 MG tablet Take 10 mg by mouth daily.        Allergies  Allergen Reactions  . Penicillins Other (See Comments)    Unknown reaction.  Mother told him he was allergic.   Review of Systems:             General:                      normal appetite, decreased energy               Respiratory:  no cough, no wheezing, no hemoptysis, no pain with inspiration or cough, + mild exertional shortness of breath             Cardiac:                      + intermittent chest pain or tightness typically with stress or exertion, + exertional SOB, no resting SOB, no PND, no orthopnea, no LE edema, no palpitations, no syncope             GI:                                no difficulty swallowing, no hematochezia, no hematemesis, no melena, + constipation, no diarrhea               GU:                              no dysuria, no urgency, no frequency               Musculoskeletal:         no arthritis, mild arthralgia both knees and recently in both feet             Vascular:                     no pain suggestive of claudication               Neuro:                         no symptoms suggestive of TIA's, no seizures, no headaches, no peripheral neuropathy               Endocrine:                   Negative                HEENT:                       no loose teeth or painful teeth, goes to his dentist regularly, no recent vision changes             Psych:                         + anxiety, no depression                Physical Exam:              BP 121/74  Pulse 56  Temp 98.2 F (36.8 C)  Resp 16  Ht 5' 11.5" (1.816 m)  Wt 201 lb (91.173 kg)  BMI 27.64 kg/m2  SpO2 98%             General:                        well-appearing             HEENT:                       Unremarkable               Neck:  no JVD, no bruits, no adenopathy               Chest:                         clear to auscultation, symmetrical breath sounds, no wheezes, no rhonchi               CV:                              RRR, grade III/VI systolic murmur               Abdomen:                    soft, non-tender, no masses               Extremities:                 warm, well-perfused, pulses palpable             Rectal/GU                   Deferred             Neuro:                         Grossly non-focal and symmetrical throughout             Skin:                            Clean and dry, no rashes, no breakdown   Diagnostic Tests:  Transesophageal Echocardiography  Patient: Matthew Mclean, Luczak MR #: [id number] Study Date: 01/12/2012 ------------------------------------------------------------ LV EF: 55% - 60% ------------------------------------------------------------ Indications: Mitral regurgitation 424.0. ------------------------------------------------------------ Study Conclusions - Left ventricle: The cavity size was at the upper limits of normal. Wall thickness was normal. Systolic function was normal. The estimated ejection fraction was in the range of 55% to 60%. Wall motion was normal; there were no regional wall motion abnormalities. - Aortic valve: No evidence of vegetation. - Mitral valve: Mildly dilated annulus. Moderately thickened leaflets . Moderate myxomatous  degeneration. Severe, holosystolicprolapse, involving the medial and middle scallop of the posterior leaflet. The maximum prolapse dimension was 10mm. Moderate to severe regurgitation directed anteriorly. - Left atrium: The atrium was dilated. No evidence of thrombus in the atrial cavity or appendage. No evidence of thrombus in the atrial cavity or appendage. - Right atrium: No evidence of thrombus in the atrial cavity or appendage. No evidence of thrombus in the atrial cavity or appendage. - Atrial septum: No defect or patent foramen ovale was identified. Echo contrast study showed no right-to-left atrial level shunt, following an increase in RA pressure induced by provocative maneuvers. Echo contrast study showed no right-to-left atrial level shunt, at baseline or with provocation. - Tricuspid valve: No evidence of vegetation. - Pulmonic valve: No evidence of vegetation. ------------------------------------------------------------ Left ventricle: The cavity size was at the upper limits of normal. Wall thickness was normal. Systolic function was normal. The estimated ejection fraction was in the range of 55% to 60%. Wall motion was normal; there were no regional wall motion abnormalities. ------------------------------------------------------------ Aortic valve: Structurally normal valve. Trileaflet; normal thickness leaflets. Cusp separation was normal. No evidence of vegetation. Doppler: No regurgitation. ------------------------------------------------------------ Aorta: The aorta was normal, not dilated,  and non-diseased. There was no atheroma. There was no evidence for dissection. Aortic root: The aortic root was not dilated. Ascending aorta: The ascending aorta was normal in size. Aortic arch: The aortic arch was normal in size. Descending aorta: The descending aorta was normal in size. ------------------------------------------------------------ Mitral valve: Mildly  dilated annulus. Moderately thickened leaflets . Moderate myxomatous degeneration. Leaflet separation was normal. Severe, holosystolicprolapse, involving the medial and middle scallop of the posterior leaflet. The maximum prolapse dimension was 10mm. Doppler: Moderate to severe regurgitation directed anteriorly. ------------------------------------------------------------ Left atrium: The atrium was dilated. No evidence of thrombus in the atrial cavity or appendage. No evidence of thrombus in the atrial cavity or appendage. The appendage was morphologically a left appendage, multilobulated, and of normal size. Emptying velocity was normal. ------------------------------------------------------------ Atrial septum: No defect or patent foramen ovale was identified. Echo contrast study showed no right-to-left atrial level shunt, following an increase in RA pressure induced by provocative maneuvers. Echo contrast study showed no right-to-left atrial level shunt, at baseline or with provocation. ------------------------------------------------------------ Right ventricle: The cavity size was normal. Wall thickness was normal. Systolic function was normal. ------------------------------------------------------------ Pulmonic valve: Structurally normal valve. Cusp separation was normal. No evidence of vegetation. ------------------------------------------------------------ Tricuspid valve: Structurally normal valve. Leaflet separation was normal. No evidence of vegetation. Doppler: No regurgitation. ------------------------------------------------------------ Pulmonary artery: The main pulmonary artery was normal-sized. ------------------------------------------------------------ Right atrium: The atrium was normal in size. No evidence of thrombus in the atrial cavity or appendage. No evidence of thrombus in the atrial cavity or appendage. The appendage was morphologically a right  appendage. ------------------------------------------------------------ Pericardium: There was no pericardial effusion. ------------------------------------------------------------ Post procedure conclusions Ascending Aorta: - The aorta was normal, not dilated, and non-diseased. ------------------------------------------------------------ 2D measurements Normal Doppler measurements Normal Mitral valve Mitral valve Annulus ML 43.34 mm ------ Regurg 32.6 cm/s ------ diam, D alias vel, PISA Regurg 12.8 mm ------ radius, PISA Max regurg 551 cm/s ------ vel Regurg VTI 131 cm ------ ERO, PISA 0.61 cm^2 ------ Regurg 79.8 ml ------ vol, PISA ------------------------------------------------------------ Amended  Croitoru, Mihai [id number]  CARDIAC CATHETERIZATION    HEMODYNAMICS:  AO SYSTOLIC/AO DIASTOLIC: 114/74  LV SYSTOLIC/LV DIASTOLIC: 119/15  RA pressure: 16/13   RV pressure: 31/11   Pulmonary artery pressure: 28/17   Pulmonary capillary wedge pressure: A wave 20 V wave 21 mean 17   Cardiac output by Fick was 4.6 L per minute, and bicarbonate dilution 4 L per minute   ANGIOGRAPHIC RESULTS:  1. Left main; normal   2. LAD; normal   3. Left circumflex; normal.   4. Right coronary artery; dominant and normal   5. Left ventriculography; RAO left ventriculogram was performed using   25 mL of Visipaque dye at 12 mL/second. The overall LVEF estimated   60 % Without wall motion abnormalities   Thoracic and abdominal aortogram: Normal, with normal renal arteries are normal iliac bifurcation   IMPRESSION:Mr. Caples has normal coronary arteries, normal left ventricular function, mildly elevated right-sided pressures and normal thoracic aorta. The sheaths were removed and pressure was held on the groin to achieve hemostasis. The patient left the lab in stable condition. He was gently hydrated, discharged home in 4-5 hours and will see back in the office later this week in  followup. Dr.Cub Cornelius Moras Was notified of these results.   Runell Gess MD, [facility]   02/06/2012   3:49 PM       Impression:  Mitral valve prolapse with severe, symptomatic mitral regurgitation. The patient has normal left ventricular systolic function  and is otherwise healthy. Based upon review of his recent transesophageal echocardiogram, there is a very high likelihood that his valve should be repairable with excellent durable result.    Plan:  The rationale for elective mitral valve repair surgery has been explained, including a comparison between surgery and continued medical therapy with close follow-up.  The likelihood of successful and durable valve repair has been discussed with particular reference to the findings of their recent echocardiogram.  Based upon these findings and previous experience, I have quoted them a greater than 98 percent likelihood of successful valve repair.  In the unlikely event that their valve cannot be successfully repaired, we discussed the possibility of replacing the mitral valve using a mechanical prosthesis with the attendant need for long-term anticoagulation versus the alternative of replacing it using a bioprosthetic tissue valve with its potential for late structural valve deterioration and failure, depending upon the patient's longevity.  Alternative surgical approaches have been discussed, including a comparison between conventional sternotomy and minimally-invasive techniques.  The relative risks and benefits of each have been reviewed as they pertain to the patient's specific circumstances, and all of their questions have been addressed. We tentatively plan to proceed with surgery on Tuesday, May 7.        [name]. Cornelius Moras, MD

## 2012-02-17 ENCOUNTER — Ambulatory Visit (HOSPITAL_COMMUNITY)
Admission: RE | Admit: 2012-02-17 | Discharge: 2012-02-17 | Disposition: A | Discharge status: Home / Self Care | Payer: BC Managed Care – PPO | Source: Ambulatory Visit | Attending: Thoracic Surgery (Cardiothoracic Vascular Surgery) | Admitting: Thoracic Surgery (Cardiothoracic Vascular Surgery)

## 2012-02-17 ENCOUNTER — Encounter (HOSPITAL_COMMUNITY)
Admission: RE | Admit: 2012-02-17 | Discharge: 2012-02-17 | Disposition: A | Discharge status: Home / Self Care | Payer: BC Managed Care – PPO | Source: Ambulatory Visit | Attending: Thoracic Surgery (Cardiothoracic Vascular Surgery) | Admitting: Thoracic Surgery (Cardiothoracic Vascular Surgery)

## 2012-02-17 ENCOUNTER — Encounter (HOSPITAL_COMMUNITY): Payer: Self-pay

## 2012-02-17 ENCOUNTER — Inpatient Hospital Stay (HOSPITAL_COMMUNITY)
Admission: RE | Admit: 2012-02-17 | Discharge: 2012-02-17 | Disposition: A | Discharge status: Home / Self Care | Payer: BC Managed Care – PPO | Source: Ambulatory Visit | Attending: Thoracic Surgery (Cardiothoracic Vascular Surgery) | Admitting: Thoracic Surgery (Cardiothoracic Vascular Surgery)

## 2012-02-17 VITALS — BP 118/76 | HR 57 | Temp 98.5°F | Resp 20 | Ht 71.0 in | Wt 197.1 lb

## 2012-02-17 DIAGNOSIS — Z01810 Encounter for preprocedural cardiovascular examination: Secondary | ICD-10-CM

## 2012-02-17 DIAGNOSIS — I059 Rheumatic mitral valve disease, unspecified: Secondary | ICD-10-CM | POA: Insufficient documentation

## 2012-02-17 DIAGNOSIS — R0602 Shortness of breath: Secondary | ICD-10-CM | POA: Insufficient documentation

## 2012-02-17 DIAGNOSIS — Z01812 Encounter for preprocedural laboratory examination: Secondary | ICD-10-CM | POA: Insufficient documentation

## 2012-02-17 HISTORY — DX: Gastro-esophageal reflux disease without esophagitis: K21.9

## 2012-02-17 HISTORY — DX: Hyperlipidemia, unspecified: E78.5

## 2012-02-17 HISTORY — DX: Cardiac murmur, unspecified: R01.1

## 2012-02-17 HISTORY — DX: Anxiety disorder, unspecified: F41.9

## 2012-02-17 HISTORY — DX: Major depressive disorder, single episode, unspecified: F32.9

## 2012-02-17 HISTORY — DX: Depression, unspecified: F32.A

## 2012-02-17 LAB — CBC
Hemoglobin: 15.7 g/dL (ref 13.0–17.0)
MCH: 30.4 pg (ref 26.0–34.0)
MCV: 86.3 fL (ref 78.0–100.0)
RBC: 5.17 MIL/uL (ref 4.22–5.81)

## 2012-02-17 LAB — SURGICAL PCR SCREEN
MRSA, PCR: NEGATIVE
Staphylococcus aureus: NEGATIVE

## 2012-02-17 LAB — ABO/RH: ABO/RH(D): O POS

## 2012-02-17 LAB — COMPREHENSIVE METABOLIC PANEL
ALT: 24 U/L (ref 0–53)
CO2: 19 mEq/L (ref 19–32)
Calcium: 9.2 mg/dL (ref 8.4–10.5)
Creatinine, Ser: 0.84 mg/dL (ref 0.50–1.35)
GFR calc Af Amer: >90 mL/min (ref >90)
GFR calc non Af Amer: >90 mL/min (ref >90)
Glucose, Bld: 131 mg/dL — ABNORMAL HIGH (ref 70–99)

## 2012-02-17 LAB — URINALYSIS, ROUTINE W REFLEX MICROSCOPIC
Bilirubin Urine: NEGATIVE
Nitrite: NEGATIVE
Specific Gravity, Urine: 1.018 (ref 1.005–1.030)
pH: 6.5 (ref 5.0–8.0)

## 2012-02-17 LAB — BLOOD GAS, ARTERIAL
Acid-base deficit: 1.5 mmol/L (ref 0.0–2.0)
Bicarbonate: 22.5 mEq/L (ref 20.0–24.0)
FIO2: 0.21 %
O2 Saturation: 97.1 %
pO2, Arterial: 84.9 mmHg (ref 80.0–100.0)

## 2012-02-17 LAB — PROTIME-INR: Prothrombin Time: 13.6 seconds (ref 11.6–15.2)

## 2012-02-17 LAB — TYPE AND SCREEN

## 2012-02-17 NOTE — Pre-Procedure Instructions (Addendum)
20 Matthew Mclean  02/17/2012   Your procedure is scheduled on:  Tuesday, May 7th.  Report to [facility] at 5:30AM.  Call this number if you have problems the morning of surgery: [phone]   Remember:   Do not eat food:After Midnight.  May have clear liquids: up to 4 Hours before arrival.   (1:30am)  Clear liquids include soda, tea, black coffee, apple or grape juice, broth.  Take these medicines the morning of surgery with A SIP OF WATER:  Amiodarone (Pacerone),  Levothyroxine (Synthyroid), Nebivolol (Bystolic).  MAy take Tyloenol and Chlor- Trimeton.  Stop taking Ibuprofen.  Do not take any Aspirin, NSAIDS or herbal Medications.   Do not wear jewelry, make-up or nail polish.  Do not wear lotions, powders, or perfumes. You may wear deodorant.  Do not shave 48 hours prior to surgery.  Do not bring valuables to the hospital.  Contacts, dentures or bridgework may not be worn into surgery.  Leave suitcase in the car. After surgery it may be brought to your room.  For patients admitted to the hospital, checkout time is 11:00 AM the day of discharge.   Patients discharged the day of surgery will not be allowed to drive home.  Name and phone number of your driver: ==  Special Instructions: CHG Shower Use Special Wash: 1/2 bottle night before surgery and 1/2 bottle morning of surgery.   Please read over the following fact sheets that you were given: Pain Booklet, Coughing and Deep Breathing, Blood Transfusion Information, MRSA Information and Surgical Site Infection Prevention  ,Incentive Spirometer and Cardiac Booklet.

## 2012-02-17 NOTE — Progress Notes (Signed)
VASCULAR LAB PRELIMINARY  PRELIMINARY  PRELIMINARY  PRELIMINARY  Pre-op Cardiac Surgery  Carotid Findings:  Bilateral:  No evidence of hemodynamically significant internal carotid artery stenosis.   Vertebral artery flow is antegrade.     Upper Extremity Right Left  Brachial Pressures 117  Triphasic 115 Triphasic  Radial Waveforms Triphasic Triphasic  Ulnar Waveforms Triphasic Triphasic  Palmer Arch Normal Normal   Findings:  Palmer arch evaluation - Doppler waveforms remained normal bilaterally with both radial and ulnar compressions         Maureen Duesing D, RVS 02/17/2012, 2:03 PM

## 2012-02-18 LAB — HEMOGLOBIN A1C: Mean Plasma Glucose: 108 mg/dL (ref <117)

## 2012-02-20 MED ORDER — SODIUM CHLORIDE 0.9 % IV SOLN
INTRAVENOUS | Status: AC
  Administered 2012-02-21: 2.5 [IU]/h via INTRAVENOUS
  Filled 2012-02-20: qty 1

## 2012-02-20 MED ORDER — POTASSIUM CHLORIDE 2 MEQ/ML IV SOLN
80.0000 meq | INTRAVENOUS | Status: DC
  Filled 2012-02-20: qty 40

## 2012-02-20 MED ORDER — NITROGLYCERIN IN D5W 200-5 MCG/ML-% IV SOLN
2.0000 ug/min | INTRAVENOUS | Status: DC
  Filled 2012-02-20: qty 250

## 2012-02-20 MED ORDER — PHENYLEPHRINE HCL 10 MG/ML IJ SOLN
30.0000 ug/min | INTRAVENOUS | Status: AC
  Administered 2012-02-21: 20 ug/min via INTRAVENOUS
  Filled 2012-02-20: qty 2

## 2012-02-20 MED ORDER — TRANEXAMIC ACID (OHS) BOLUS VIA INFUSION
15.0000 mg/kg | INTRAVENOUS | Status: DC
  Filled 2012-02-20: qty 1368

## 2012-02-20 MED ORDER — MAGNESIUM SULFATE 50 % IJ SOLN
40.0000 meq | INTRAMUSCULAR | Status: DC
  Filled 2012-02-20: qty 10

## 2012-02-20 MED ORDER — EPINEPHRINE HCL 1 MG/ML IJ SOLN
0.5000 ug/min | INTRAVENOUS | Status: DC
  Filled 2012-02-20: qty 4

## 2012-02-20 MED ORDER — CHLORHEXIDINE GLUCONATE 4 % EX LIQD: 30.0000 mL | CUTANEOUS | Status: DC

## 2012-02-20 MED ORDER — VANCOMYCIN HCL 1000 MG IV SOLR
1500.0000 mg | INTRAVENOUS | Status: AC
  Administered 2012-02-21: 1500 mg via INTRAVENOUS
  Filled 2012-02-20: qty 1500

## 2012-02-20 MED ORDER — SODIUM CHLORIDE 0.9 % IV SOLN
0.1000 ug/kg/h | INTRAVENOUS | Status: AC
  Administered 2012-02-21: 15:00:00 via INTRAVENOUS
  Administered 2012-02-21: .2 ug/kg/h via INTRAVENOUS
  Filled 2012-02-20: qty 4

## 2012-02-20 MED ORDER — TRANEXAMIC ACID (OHS) PUMP PRIME SOLUTION
2.0000 mg/kg | INTRAVENOUS | Status: DC
  Filled 2012-02-20: qty 1.82

## 2012-02-20 MED ORDER — DOPAMINE-DEXTROSE 3.2-5 MG/ML-% IV SOLN
2.0000 ug/kg/min | INTRAVENOUS | Status: DC
  Filled 2012-02-20: qty 250

## 2012-02-20 MED ORDER — TRANEXAMIC ACID 100 MG/ML IV SOLN
1.5000 mg/kg/h | INTRAVENOUS | Status: AC
  Administered 2012-02-21: 15 mg/kg/h via INTRAVENOUS
  Filled 2012-02-20: qty 25

## 2012-02-20 MED ORDER — PLASMA-LYTE 148 IV SOLN
INTRAVENOUS | Status: DC
  Filled 2012-02-20: qty 2.5

## 2012-02-20 MED ORDER — MOXIFLOXACIN HCL IN NACL 400 MG/250ML IV SOLN
400.0000 mg | INTRAVENOUS | Status: AC
  Administered 2012-02-21: 400 mg via INTRAVENOUS
  Filled 2012-02-20: qty 250

## 2012-02-20 MED ORDER — METOPROLOL TARTRATE 12.5 MG HALF TABLET: 12.5000 mg | ORAL_TABLET | Freq: Once | ORAL | Status: DC

## 2012-02-21 ENCOUNTER — Encounter (HOSPITAL_COMMUNITY): Payer: Self-pay | Admitting: *Deleted

## 2012-02-21 ENCOUNTER — Inpatient Hospital Stay (HOSPITAL_COMMUNITY): Payer: BC Managed Care – PPO

## 2012-02-21 ENCOUNTER — Encounter (HOSPITAL_COMMUNITY): Payer: Self-pay | Admitting: Thoracic Surgery (Cardiothoracic Vascular Surgery)

## 2012-02-21 ENCOUNTER — Ambulatory Visit (HOSPITAL_COMMUNITY): Payer: BC Managed Care – PPO | Admitting: Critical Care Medicine

## 2012-02-21 ENCOUNTER — Encounter (HOSPITAL_COMMUNITY)
Admission: RE | Disposition: A | Discharge status: Home / Self Care | Payer: Self-pay | Source: Ambulatory Visit | Attending: Thoracic Surgery (Cardiothoracic Vascular Surgery)

## 2012-02-21 ENCOUNTER — Encounter (HOSPITAL_COMMUNITY): Payer: Self-pay | Admitting: Critical Care Medicine

## 2012-02-21 ENCOUNTER — Inpatient Hospital Stay (HOSPITAL_COMMUNITY)
Admission: RE | Admit: 2012-02-21 | Discharge: 2012-02-25 | DRG: 112 | Disposition: A | Discharge status: Home / Self Care | Payer: BC Managed Care – PPO | Source: Ambulatory Visit | Attending: Thoracic Surgery (Cardiothoracic Vascular Surgery) | Admitting: Thoracic Surgery (Cardiothoracic Vascular Surgery)

## 2012-02-21 DIAGNOSIS — F411 Generalized anxiety disorder: Secondary | ICD-10-CM | POA: Diagnosis present

## 2012-02-21 DIAGNOSIS — I10 Essential (primary) hypertension: Secondary | ICD-10-CM | POA: Diagnosis present

## 2012-02-21 DIAGNOSIS — K219 Gastro-esophageal reflux disease without esophagitis: Secondary | ICD-10-CM | POA: Diagnosis present

## 2012-02-21 DIAGNOSIS — E8779 Other fluid overload: Secondary | ICD-10-CM | POA: Diagnosis not present

## 2012-02-21 DIAGNOSIS — E785 Hyperlipidemia, unspecified: Secondary | ICD-10-CM | POA: Diagnosis present

## 2012-02-21 DIAGNOSIS — D696 Thrombocytopenia, unspecified: Secondary | ICD-10-CM | POA: Diagnosis not present

## 2012-02-21 DIAGNOSIS — I059 Rheumatic mitral valve disease, unspecified: Secondary | ICD-10-CM

## 2012-02-21 DIAGNOSIS — Z880 Allergy status to penicillin: Secondary | ICD-10-CM

## 2012-02-21 DIAGNOSIS — D62 Acute posthemorrhagic anemia: Secondary | ICD-10-CM | POA: Diagnosis not present

## 2012-02-21 DIAGNOSIS — E039 Hypothyroidism, unspecified: Secondary | ICD-10-CM | POA: Diagnosis present

## 2012-02-21 DIAGNOSIS — Z9889 Other specified postprocedural states: Secondary | ICD-10-CM

## 2012-02-21 DIAGNOSIS — F329 Major depressive disorder, single episode, unspecified: Secondary | ICD-10-CM | POA: Diagnosis present

## 2012-02-21 DIAGNOSIS — R011 Cardiac murmur, unspecified: Secondary | ICD-10-CM | POA: Diagnosis not present

## 2012-02-21 DIAGNOSIS — E876 Hypokalemia: Secondary | ICD-10-CM | POA: Diagnosis not present

## 2012-02-21 DIAGNOSIS — F3289 Other specified depressive episodes: Secondary | ICD-10-CM | POA: Diagnosis present

## 2012-02-21 DIAGNOSIS — R0602 Shortness of breath: Secondary | ICD-10-CM | POA: Diagnosis present

## 2012-02-21 DIAGNOSIS — I341 Nonrheumatic mitral (valve) prolapse: Secondary | ICD-10-CM | POA: Diagnosis present

## 2012-02-21 DIAGNOSIS — IMO0001 Reserved for inherently not codable concepts without codable children: Secondary | ICD-10-CM

## 2012-02-21 DIAGNOSIS — I340 Nonrheumatic mitral (valve) insufficiency: Secondary | ICD-10-CM | POA: Diagnosis present

## 2012-02-21 HISTORY — DX: Other specified postprocedural states: Z98.890

## 2012-02-21 HISTORY — PX: MITRAL VALVE REPAIR: SHX2039

## 2012-02-21 LAB — POCT I-STAT 3, ART BLOOD GAS (G3+)
Acid-base deficit: 2 mmol/L (ref 0.0–2.0)
Acid-base deficit: 3 mmol/L — ABNORMAL HIGH (ref 0.0–2.0)
Acid-base deficit: 4 mmol/L — ABNORMAL HIGH (ref 0.0–2.0)
Acid-base deficit: 5 mmol/L — ABNORMAL HIGH (ref 0.0–2.0)
Bicarbonate: 22.1 mEq/L (ref 20.0–24.0)
O2 Saturation: 100 %
O2 Saturation: 98 %
TCO2: 24 mmol/L (ref 0–100)
pCO2 arterial: 37.4 mmHg (ref 35.0–45.0)
pCO2 arterial: 39.4 mmHg (ref 35.0–45.0)
pCO2 arterial: 45 mmHg (ref 35.0–45.0)
pCO2 arterial: 42 mmHg (ref 35.0–45.0)
pH, Arterial: 7.379 (ref 7.350–7.450)
pH, Arterial: 7.296 — ABNORMAL LOW (ref 7.350–7.450)
pH, Arterial: 7.305 — ABNORMAL LOW (ref 7.350–7.450)
pO2, Arterial: 103 mmHg — ABNORMAL HIGH (ref 80.0–100.0)
pO2, Arterial: 122 mmHg — ABNORMAL HIGH (ref 80.0–100.0)
pO2, Arterial: 136 mmHg — ABNORMAL HIGH (ref 80.0–100.0)

## 2012-02-21 LAB — POCT I-STAT 4, (NA,K, GLUC, HGB,HCT)
Glucose, Bld: 95 mg/dL (ref 70–99)
Glucose, Bld: 81 mg/dL (ref 70–99)
Glucose, Bld: 118 mg/dL — ABNORMAL HIGH (ref 70–99)
Glucose, Bld: 100 mg/dL — ABNORMAL HIGH (ref 70–99)
HCT: 43 % (ref 39.0–52.0)
HCT: 29 % — ABNORMAL LOW (ref 39.0–52.0)
HCT: 32 % — ABNORMAL LOW (ref 39.0–52.0)
HCT: 36 % — ABNORMAL LOW (ref 39.0–52.0)
Hemoglobin: 12.9 g/dL — ABNORMAL LOW (ref 13.0–17.0)
Hemoglobin: 9.9 g/dL — ABNORMAL LOW (ref 13.0–17.0)
Hemoglobin: 10.9 g/dL — ABNORMAL LOW (ref 13.0–17.0)
Hemoglobin: 10.9 g/dL — ABNORMAL LOW (ref 13.0–17.0)
Potassium: 4 mEq/L (ref 3.5–5.1)
Potassium: 4.2 mEq/L (ref 3.5–5.1)
Potassium: 5.3 mEq/L — ABNORMAL HIGH (ref 3.5–5.1)
Potassium: 4.5 mEq/L (ref 3.5–5.1)
Potassium: 3.7 mEq/L (ref 3.5–5.1)
Sodium: 129 mEq/L — ABNORMAL LOW (ref 135–145)
Sodium: 135 mEq/L (ref 135–145)
Sodium: 137 mEq/L (ref 135–145)
Sodium: 138 mEq/L (ref 135–145)
Sodium: 141 mEq/L (ref 135–145)

## 2012-02-21 LAB — CBC
HCT: 35.9 % — ABNORMAL LOW (ref 39.0–52.0)
Hemoglobin: 10.7 g/dL — ABNORMAL LOW (ref 13.0–17.0)
MCH: 28.9 pg (ref 26.0–34.0)
MCHC: 33.8 g/dL (ref 30.0–36.0)
MCV: 85.7 fL (ref 78.0–100.0)
MCV: 85.7 fL (ref 78.0–100.0)
RBC: 4.19 MIL/uL — ABNORMAL LOW (ref 4.22–5.81)
RBC: 3.7 MIL/uL — ABNORMAL LOW (ref 4.22–5.81)
WBC: 5.4 10*3/uL (ref 4.0–10.5)

## 2012-02-21 LAB — POCT I-STAT, CHEM 8
Chloride: 110 mEq/L (ref 96–112)
Creatinine, Ser: 0.9 mg/dL (ref 0.50–1.35)
HCT: 30 % — ABNORMAL LOW (ref 39.0–52.0)
Hemoglobin: 10.2 g/dL — ABNORMAL LOW (ref 13.0–17.0)
Potassium: 5.2 mEq/L — ABNORMAL HIGH (ref 3.5–5.1)
Sodium: 139 mEq/L (ref 135–145)

## 2012-02-21 LAB — APTT: aPTT: 35 seconds (ref 24–37)

## 2012-02-21 LAB — GLUCOSE, CAPILLARY
Glucose-Capillary: 82 mg/dL (ref 70–99)
Glucose-Capillary: 91 mg/dL (ref 70–99)
Glucose-Capillary: 85 mg/dL (ref 70–99)

## 2012-02-21 LAB — PROTIME-INR: INR: 1.43 (ref 0.00–1.49)

## 2012-02-21 LAB — CREATININE, SERUM: GFR calc Af Amer: >90 mL/min (ref >90)

## 2012-02-21 LAB — PLATELET COUNT: Platelets: 92 10*3/uL — ABNORMAL LOW (ref 150–400)

## 2012-02-21 LAB — HEMOGLOBIN AND HEMATOCRIT, BLOOD: HCT: 31.8 % — ABNORMAL LOW (ref 39.0–52.0)

## 2012-02-21 SURGERY — REPAIR, MITRAL VALVE, MINIMALLY INVASIVE
Anesthesia: General | Site: Chest | Laterality: Right | Wound class: Clean

## 2012-02-21 MED ORDER — PANTOPRAZOLE SODIUM 40 MG PO TBEC
40.0000 mg | DELAYED_RELEASE_TABLET | Freq: Every day | ORAL | Status: DC
  Administered 2012-02-23 – 2012-02-24 (×2): 40 mg via ORAL
  Filled 2012-02-21 (×2): qty 1

## 2012-02-21 MED ORDER — MORPHINE SULFATE 2 MG/ML IJ SOLN
2.0000 mg | INTRAMUSCULAR | Status: DC | PRN
  Administered 2012-02-21 – 2012-02-22 (×4): 2 mg via INTRAVENOUS
  Filled 2012-02-21: qty 2
  Filled 2012-02-21 (×4): qty 1

## 2012-02-21 MED ORDER — LACTATED RINGERS IV SOLN
INTRAVENOUS | Status: DC | PRN
  Administered 2012-02-21 (×2): via INTRAVENOUS

## 2012-02-21 MED ORDER — INSULIN REGULAR BOLUS VIA INFUSION
0.0000 [IU] | Freq: Three times a day (TID) | INTRAVENOUS | Status: DC
  Filled 2012-02-21: qty 10

## 2012-02-21 MED ORDER — ONDANSETRON HCL 4 MG/2ML IJ SOLN
4.0000 mg | Freq: Four times a day (QID) | INTRAMUSCULAR | Status: DC | PRN
  Administered 2012-02-22: 4 mg via INTRAVENOUS
  Filled 2012-02-21: qty 2

## 2012-02-21 MED ORDER — FENTANYL CITRATE 0.05 MG/ML IJ SOLN
INTRAMUSCULAR | Status: DC | PRN
  Administered 2012-02-21: 50 ug via INTRAVENOUS
  Administered 2012-02-21: 100 ug via INTRAVENOUS
  Administered 2012-02-21: 150 ug via INTRAVENOUS
  Administered 2012-02-21 (×2): 100 ug via INTRAVENOUS
  Administered 2012-02-21: 150 ug via INTRAVENOUS
  Administered 2012-02-21 (×2): 100 ug via INTRAVENOUS
  Administered 2012-02-21 (×2): 50 ug via INTRAVENOUS

## 2012-02-21 MED ORDER — ACETAMINOPHEN 160 MG/5ML PO SOLN: 975.0000 mg | Freq: Four times a day (QID) | ORAL | Status: DC

## 2012-02-21 MED ORDER — MIDAZOLAM HCL 5 MG/5ML IJ SOLN
INTRAMUSCULAR | Status: DC | PRN
  Administered 2012-02-21 (×2): 5 mg via INTRAVENOUS
  Administered 2012-02-21: 1 mg via INTRAVENOUS
  Administered 2012-02-21 (×2): 2 mg via INTRAVENOUS
  Administered 2012-02-21: 1 mg via INTRAVENOUS
  Administered 2012-02-21 (×2): 2 mg via INTRAVENOUS

## 2012-02-21 MED ORDER — VANCOMYCIN HCL IN DEXTROSE 1-5 GM/200ML-% IV SOLN
1000.0000 mg | Freq: Once | INTRAVENOUS | Status: AC
  Administered 2012-02-21: 1000 mg via INTRAVENOUS
  Filled 2012-02-21: qty 200

## 2012-02-21 MED ORDER — BISACODYL 10 MG RE SUPP: 10.0000 mg | Freq: Every day | RECTAL | Status: DC

## 2012-02-21 MED ORDER — POTASSIUM CHLORIDE 10 MEQ/50ML IV SOLN
10.0000 meq | INTRAVENOUS | Status: AC
  Administered 2012-02-21 (×3): 10 meq via INTRAVENOUS

## 2012-02-21 MED ORDER — VECURONIUM BROMIDE 10 MG IV SOLR
INTRAVENOUS | Status: DC | PRN
  Administered 2012-02-21: 10 mg via INTRAVENOUS
  Administered 2012-02-21: 5 mg via INTRAVENOUS
  Administered 2012-02-21 (×2): 10 mg via INTRAVENOUS
  Administered 2012-02-21: 5 mg via INTRAVENOUS

## 2012-02-21 MED ORDER — ACETAMINOPHEN 160 MG/5ML PO SOLN: 650.0000 mg | ORAL | Status: AC

## 2012-02-21 MED ORDER — SODIUM CHLORIDE 0.9 % IV SOLN: 250.0000 mL | INTRAVENOUS | Status: DC

## 2012-02-21 MED ORDER — MAGNESIUM SULFATE 40 MG/ML IJ SOLN
4.0000 g | Freq: Once | INTRAMUSCULAR | Status: AC
  Administered 2012-02-21: 4 g via INTRAVENOUS
  Filled 2012-02-21: qty 100

## 2012-02-21 MED ORDER — BUPIVACAINE 0.5 % ON-Q PUMP SINGLE CATH 300 ML
300.0000 mL | INJECTION | Status: DC
  Filled 2012-02-21: qty 300

## 2012-02-21 MED ORDER — LEVOTHYROXINE SODIUM 125 MCG PO TABS
125.0000 ug | ORAL_TABLET | Freq: Every day | ORAL | Status: DC
  Administered 2012-02-22 – 2012-02-24 (×3): 125 ug via ORAL
  Filled 2012-02-21 (×5): qty 1

## 2012-02-21 MED ORDER — MORPHINE SULFATE 2 MG/ML IJ SOLN: 1.0000 mg | INTRAMUSCULAR | Status: AC | PRN

## 2012-02-21 MED ORDER — SODIUM CHLORIDE 0.9 % IJ SOLN: 3.0000 mL | Freq: Two times a day (BID) | INTRAMUSCULAR | Status: DC

## 2012-02-21 MED ORDER — ALBUMIN HUMAN 5 % IV SOLN
250.0000 mL | INTRAVENOUS | Status: DC | PRN
  Administered 2012-02-21: 250 mL via INTRAVENOUS
  Filled 2012-02-21: qty 500

## 2012-02-21 MED ORDER — INSULIN ASPART 100 UNIT/ML ~~LOC~~ SOLN
0.0000 [IU] | SUBCUTANEOUS | Status: DC
  Administered 2012-02-21: 2 [IU] via SUBCUTANEOUS

## 2012-02-21 MED ORDER — POTASSIUM CHLORIDE 10 MEQ/50ML IV SOLN
10.0000 meq | Freq: Once | INTRAVENOUS | Status: AC
  Administered 2012-02-21: 10 meq via INTRAVENOUS

## 2012-02-21 MED ORDER — MOXIFLOXACIN HCL IN NACL 400 MG/250ML IV SOLN
400.0000 mg | INTRAVENOUS | Status: AC
  Administered 2012-02-22: 400 mg via INTRAVENOUS
  Filled 2012-02-21: qty 250

## 2012-02-21 MED ORDER — OXYCODONE HCL 5 MG PO TABS: 5.0000 mg | ORAL_TABLET | ORAL | Status: DC | PRN

## 2012-02-21 MED ORDER — SODIUM CHLORIDE 0.9 % IV SOLN: INTRAVENOUS | Status: DC

## 2012-02-21 MED ORDER — ASPIRIN EC 325 MG PO TBEC
325.0000 mg | DELAYED_RELEASE_TABLET | Freq: Every day | ORAL | Status: DC
  Filled 2012-02-21: qty 1

## 2012-02-21 MED ORDER — PHENYLEPHRINE HCL 10 MG/ML IJ SOLN
0.0000 ug/min | INTRAVENOUS | Status: DC
  Filled 2012-02-21: qty 2

## 2012-02-21 MED ORDER — 0.9 % SODIUM CHLORIDE (POUR BTL) OPTIME
TOPICAL | Status: DC | PRN
  Administered 2012-02-21: 6000 mL

## 2012-02-21 MED ORDER — DOCUSATE SODIUM 100 MG PO CAPS
200.0000 mg | ORAL_CAPSULE | Freq: Every day | ORAL | Status: DC
  Administered 2012-02-22 – 2012-02-24 (×3): 200 mg via ORAL
  Filled 2012-02-21 (×4): qty 2

## 2012-02-21 MED ORDER — PROPOFOL 10 MG/ML IV EMUL
INTRAVENOUS | Status: DC | PRN
  Administered 2012-02-21: 200 mg via INTRAVENOUS

## 2012-02-21 MED ORDER — ACETAMINOPHEN 650 MG RE SUPP
650.0000 mg | RECTAL | Status: AC
  Administered 2012-02-21: 650 mg via RECTAL

## 2012-02-21 MED ORDER — BUPIVACAINE HCL 0.5 % IJ SOLN
INTRAMUSCULAR | Status: DC | PRN
  Administered 2012-02-21: 300 mL

## 2012-02-21 MED ORDER — FAMOTIDINE IN NACL 20-0.9 MG/50ML-% IV SOLN
20.0000 mg | Freq: Two times a day (BID) | INTRAVENOUS | Status: AC
  Administered 2012-02-21 (×2): 20 mg via INTRAVENOUS
  Filled 2012-02-21: qty 50

## 2012-02-21 MED ORDER — SODIUM CHLORIDE 0.9 % IR SOLN
Status: DC | PRN
  Administered 2012-02-21: 3000 mL

## 2012-02-21 MED ORDER — LACTATED RINGERS IV SOLN: INTRAVENOUS | Status: DC

## 2012-02-21 MED ORDER — SODIUM CHLORIDE 0.45 % IV SOLN
INTRAVENOUS | Status: DC
  Administered 2012-02-21: 20 mL/h via INTRAVENOUS

## 2012-02-21 MED ORDER — SODIUM CHLORIDE 0.9 % IV SOLN
0.4000 ug/kg/h | INTRAVENOUS | Status: DC
  Administered 2012-02-21: 0.4 ug/kg/h via INTRAVENOUS
  Filled 2012-02-21: qty 2

## 2012-02-21 MED ORDER — ASPIRIN 81 MG PO CHEW: 324.0000 mg | CHEWABLE_TABLET | Freq: Every day | ORAL | Status: DC

## 2012-02-21 MED ORDER — NITROGLYCERIN IN D5W 200-5 MCG/ML-% IV SOLN
INTRAVENOUS | Status: DC | PRN
  Administered 2012-02-21: 5 ug/min via INTRAVENOUS

## 2012-02-21 MED ORDER — SODIUM CHLORIDE 0.9 % IJ SOLN: 3.0000 mL | INTRAMUSCULAR | Status: DC | PRN

## 2012-02-21 MED ORDER — HEPARIN SODIUM (PORCINE) 1000 UNIT/ML IJ SOLN
INTRAMUSCULAR | Status: DC | PRN
  Administered 2012-02-21: 30000 [IU] via INTRAVENOUS

## 2012-02-21 MED ORDER — METOPROLOL TARTRATE 1 MG/ML IV SOLN: 2.5000 mg | INTRAVENOUS | Status: DC | PRN

## 2012-02-21 MED ORDER — INSULIN ASPART 100 UNIT/ML ~~LOC~~ SOLN
0.0000 [IU] | SUBCUTANEOUS | Status: AC
  Administered 2012-02-21: 2 [IU] via SUBCUTANEOUS

## 2012-02-21 MED ORDER — METOPROLOL TARTRATE 12.5 MG HALF TABLET
12.5000 mg | ORAL_TABLET | Freq: Two times a day (BID) | ORAL | Status: DC
  Filled 2012-02-21 (×3): qty 1

## 2012-02-21 MED ORDER — BISACODYL 5 MG PO TBEC
10.0000 mg | DELAYED_RELEASE_TABLET | Freq: Every day | ORAL | Status: DC
  Administered 2012-02-22 – 2012-02-23 (×2): 10 mg via ORAL
  Filled 2012-02-21 (×3): qty 2

## 2012-02-21 MED ORDER — PROTAMINE SULFATE 10 MG/ML IV SOLN
INTRAVENOUS | Status: DC | PRN
  Administered 2012-02-21: 10 mg via INTRAVENOUS
  Administered 2012-02-21 (×2): 50 mg via INTRAVENOUS
  Administered 2012-02-21: 40 mg via INTRAVENOUS
  Administered 2012-02-21 (×3): 50 mg via INTRAVENOUS

## 2012-02-21 MED ORDER — AMIODARONE HCL 200 MG PO TABS
200.0000 mg | ORAL_TABLET | Freq: Two times a day (BID) | ORAL | Status: DC
  Administered 2012-02-21 – 2012-02-24 (×7): 200 mg via ORAL
  Filled 2012-02-21 (×9): qty 1

## 2012-02-21 MED ORDER — ACETAMINOPHEN 500 MG PO TABS
1000.0000 mg | ORAL_TABLET | Freq: Four times a day (QID) | ORAL | Status: DC
  Administered 2012-02-21 – 2012-02-25 (×13): 1000 mg via ORAL
  Filled 2012-02-21 (×18): qty 2

## 2012-02-21 MED ORDER — ALBUMIN HUMAN 5 % IV SOLN
12.5000 g | INTRAVENOUS | Status: AC
  Administered 2012-02-21 (×2): 12.5 g via INTRAVENOUS

## 2012-02-21 MED ORDER — SODIUM CHLORIDE 0.9 % IR SOLN
Status: DC | PRN
  Administered 2012-02-21: 1000 mL

## 2012-02-21 MED ORDER — SODIUM CHLORIDE 0.9 % IV SOLN
0.1000 ug/kg/h | INTRAVENOUS | Status: DC
  Filled 2012-02-21: qty 2

## 2012-02-21 MED ORDER — METOPROLOL TARTRATE 25 MG/10 ML ORAL SUSPENSION
12.5000 mg | Freq: Two times a day (BID) | ORAL | Status: DC
  Filled 2012-02-21 (×3): qty 5

## 2012-02-21 MED ORDER — EPHEDRINE SULFATE 50 MG/ML IJ SOLN
INTRAMUSCULAR | Status: DC | PRN
  Administered 2012-02-21: 5 mg via INTRAVENOUS
  Administered 2012-02-21 (×2): 2.5 mg via INTRAVENOUS

## 2012-02-21 MED ORDER — NITROGLYCERIN IN D5W 200-5 MCG/ML-% IV SOLN: 0.0000 ug/min | INTRAVENOUS | Status: DC

## 2012-02-21 MED ORDER — MIDAZOLAM HCL 2 MG/2ML IJ SOLN: 2.0000 mg | INTRAMUSCULAR | Status: DC | PRN

## 2012-02-21 MED ORDER — CALCIUM CHLORIDE 10 % IV SOLN
1.0000 g | Freq: Once | INTRAVENOUS | Status: AC | PRN
  Filled 2012-02-21: qty 10

## 2012-02-21 MED ORDER — SODIUM CHLORIDE 0.9 % IV SOLN
INTRAVENOUS | Status: DC
  Filled 2012-02-21: qty 1

## 2012-02-21 SURGICAL SUPPLY — 127 items
ADAPTER CARDIO PERF ANTE/RETRO (ADAPTER) ×2 IMPLANT
ADH SKN CLS APL DERMABOND .7 (GAUZE/BANDAGES/DRESSINGS) ×1
ADPR PRFSN 84XANTGRD RTRGD (ADAPTER) ×1
APL SKNCLS STERI-STRIP NONHPOA (GAUZE/BANDAGES/DRESSINGS) ×1
BAG DECANTER FOR FLEXI CONT (MISCELLANEOUS) ×2 IMPLANT
BENZOIN TINCTURE PRP APPL 2/3 (GAUZE/BANDAGES/DRESSINGS) ×2 IMPLANT
BLADE STERNUM SYSTEM 6 (BLADE) ×1 IMPLANT
BLADE SURG 11 STRL SS (BLADE) ×2 IMPLANT
BLADE SURG ROTATE 9660 (MISCELLANEOUS) ×1 IMPLANT
CANISTER SUCTION 2500CC (MISCELLANEOUS) ×4 IMPLANT
CANNULA FEM VENOUS REMOTE 22FR (CANNULA) IMPLANT
CANNULA FEMORAL ART 14 SM (MISCELLANEOUS) ×2 IMPLANT
CANNULA GUNDRY RCSP 15FR (MISCELLANEOUS) ×2 IMPLANT
CANNULA OPTISITE PERFUSION 16F (CANNULA) IMPLANT
CANNULA OPTISITE PERFUSION 18F (CANNULA) ×1 IMPLANT
CARDIAC SUCTION (MISCELLANEOUS) ×2 IMPLANT
CATH KIT ON Q 5IN SLV (PAIN MANAGEMENT) ×2 IMPLANT
CLOTH BEACON ORANGE TIMEOUT ST (SAFETY) ×2 IMPLANT
CONN ST 1/4X3/8  BEN (MISCELLANEOUS) ×2
CONN ST 1/4X3/8 BEN (MISCELLANEOUS) ×2 IMPLANT
CONT SPEC 4OZ CLIKSEAL STRL BL (MISCELLANEOUS) ×1 IMPLANT
CONT SPEC STER OR (MISCELLANEOUS) ×2 IMPLANT
COVER MAYO STAND STRL (DRAPES) ×2 IMPLANT
COVER PROBE W GEL 5X96 (DRAPES) ×1 IMPLANT
COVER SURGICAL LIGHT HANDLE (MISCELLANEOUS) ×4 IMPLANT
CRADLE DONUT ADULT HEAD (MISCELLANEOUS) ×2 IMPLANT
DERMABOND ADVANCED (GAUZE/BANDAGES/DRESSINGS) ×1
DERMABOND ADVANCED .7 DNX12 (GAUZE/BANDAGES/DRESSINGS) ×2 IMPLANT
DEVICE PMI PUNCTURE CLOSURE (MISCELLANEOUS) ×2 IMPLANT
DEVICE TROCAR PUNCTURE CLOSURE (ENDOMECHANICALS) ×2 IMPLANT
DRAIN CHANNEL 28F RND 3/8 FF (WOUND CARE) ×4 IMPLANT
DRAPE BILATERAL SPLIT (DRAPES) ×2 IMPLANT
DRAPE C-ARM 42X72 X-RAY (DRAPES) ×2 IMPLANT
DRAPE CV SPLIT W-CLR ANES SCRN (DRAPES) ×2 IMPLANT
DRAPE INCISE IOBAN 66X45 STRL (DRAPES) ×5 IMPLANT
DRAPE SLUSH MACHINE 52X66 (DRAPES) IMPLANT
DRAPE SLUSH/WARMER DISC (DRAPES) IMPLANT
DRSG COVADERM 4X10 (GAUZE/BANDAGES/DRESSINGS) ×1 IMPLANT
DRSG COVADERM 4X8 (GAUZE/BANDAGES/DRESSINGS) ×2 IMPLANT
ELECT BLADE 6.5 EXT (BLADE) ×2 IMPLANT
ELECT REM PT RETURN 9FT ADLT (ELECTROSURGICAL) ×4
ELECTRODE REM PT RTRN 9FT ADLT (ELECTROSURGICAL) ×2 IMPLANT
FEMORAL VENOUS CANN RAP (CANNULA) IMPLANT
GLOVE BIO SURGEON STRL SZ 6 (GLOVE) ×3 IMPLANT
GLOVE BIO SURGEON STRL SZ 6.5 (GLOVE) ×2 IMPLANT
GLOVE BIOGEL PI IND STRL 6 (GLOVE) IMPLANT
GLOVE BIOGEL PI IND STRL 7.0 (GLOVE) IMPLANT
GLOVE BIOGEL PI INDICATOR 6 (GLOVE) ×2
GLOVE BIOGEL PI INDICATOR 7.0 (GLOVE) ×2
GLOVE ORTHO TXT STRL SZ7.5 (GLOVE) ×6 IMPLANT
GOWN STRL NON-REIN LRG LVL3 (GOWN DISPOSABLE) ×10 IMPLANT
GUIDEWIRE ANG ZIPWIRE 038X150 (WIRE) ×2 IMPLANT
INSERT CONFORM CROSS CLAMP 66M (MISCELLANEOUS) ×2 IMPLANT
INSERT CONFORM CROSS CLAMP 86M (MISCELLANEOUS) ×2 IMPLANT
KIT BASIN OR (CUSTOM PROCEDURE TRAY) ×2 IMPLANT
KIT DILATOR VASC 18G NDL (KITS) ×2 IMPLANT
KIT ROOM TURNOVER OR (KITS) ×2 IMPLANT
KIT SUCTION CATH 14FR (SUCTIONS) ×3 IMPLANT
LEAD PACING MYOCARDI (MISCELLANEOUS) ×2 IMPLANT
LINE VENT (MISCELLANEOUS) ×1 IMPLANT
NDL AORTIC ROOT 14G 7F (CATHETERS) ×1 IMPLANT
NEEDLE AORTIC ROOT 14G 7F (CATHETERS) ×2 IMPLANT
NS IRRIG 1000ML POUR BTL (IV SOLUTION) ×6 IMPLANT
PACK OPEN HEART (CUSTOM PROCEDURE TRAY) ×2 IMPLANT
PAD ARMBOARD 7.5X6 YLW CONV (MISCELLANEOUS) ×4 IMPLANT
PAD ELECT DEFIB RADIOL ZOLL (MISCELLANEOUS) ×4 IMPLANT
PATCH CORMATRIX 4CMX7CM (Prosthesis & Implant Heart) ×2 IMPLANT
RETRACTOR TRL SOFT TISSUE LG (INSTRUMENTS) IMPLANT
RETRACTOR TRM SOFT TISSUE 7.5 (INSTRUMENTS) ×1 IMPLANT
RING MITRAL MEMO 3D 36MM SMD36 (Prosthesis & Implant Heart) ×1 IMPLANT
SET CANNULATION TOURNIQUET (MISCELLANEOUS) ×2 IMPLANT
SET CARDIOPLEGIA MPS 5001102 (MISCELLANEOUS) ×2 IMPLANT
SET IRRIG TUBING LAPAROSCOPIC (IRRIGATION / IRRIGATOR) ×2 IMPLANT
SOLUTION ANTI FOG 6CC (MISCELLANEOUS) ×2 IMPLANT
SPONGE GAUZE 4X4 12PLY (GAUZE/BANDAGES/DRESSINGS) ×2 IMPLANT
SUCKER WEIGHTED FLEX (MISCELLANEOUS) ×4 IMPLANT
SUT BONE WAX W31G (SUTURE) ×2 IMPLANT
SUT E-PACK MINIMALLY INVASIVE (SUTURE) ×2 IMPLANT
SUT ETHIBOND (SUTURE) ×6 IMPLANT
SUT ETHIBOND 2 0 SH (SUTURE) ×2 IMPLANT
SUT ETHIBOND 2 0 V4 (SUTURE) IMPLANT
SUT ETHIBOND 2 0V4 GREEN (SUTURE) IMPLANT
SUT ETHIBOND 2-0 RB-1 WHT (SUTURE) ×4 IMPLANT
SUT ETHIBOND 4 0 TF (SUTURE) IMPLANT
SUT ETHIBOND 5 0 C 1 30 (SUTURE) IMPLANT
SUT ETHIBOND NAB MH 2-0 36IN (SUTURE) IMPLANT
SUT ETHIBOND X763 2 0 SH 1 (SUTURE) ×2 IMPLANT
SUT GORETEX 6.0 TH-9 30 IN (SUTURE) IMPLANT
SUT GORETEX CV 4 TH 22 36 (SUTURE) ×2 IMPLANT
SUT GORETEX CV-5THC-13 36IN (SUTURE) ×6 IMPLANT
SUT GORETEX CV4 TH-18 (SUTURE) ×7 IMPLANT
SUT GORETEX TH-18 36 INCH (SUTURE) IMPLANT
SUT MNCRL AB 3-0 PS2 18 (SUTURE) IMPLANT
SUT PROLENE 3 0 SH DA (SUTURE) IMPLANT
SUT PROLENE 3 0 SH1 36 (SUTURE) ×8 IMPLANT
SUT PROLENE 4 0 RB 1 (SUTURE) ×10
SUT PROLENE 4-0 RB1 .5 CRCL 36 (SUTURE) IMPLANT
SUT PROLENE 5 0 C 1 36 (SUTURE) IMPLANT
SUT PROLENE 6 0 C 1 30 (SUTURE) IMPLANT
SUT SILK  1 MH (SUTURE)
SUT SILK 1 MH (SUTURE) IMPLANT
SUT SILK 1 TIES 10X30 (SUTURE) IMPLANT
SUT SILK 2 0 SH CR/8 (SUTURE) IMPLANT
SUT SILK 2 0 TIES 10X30 (SUTURE) IMPLANT
SUT SILK 2 0SH CR/8 30 (SUTURE) IMPLANT
SUT SILK 3 0 (SUTURE)
SUT SILK 3 0 SH CR/8 (SUTURE) IMPLANT
SUT SILK 3 0SH CR/8 30 (SUTURE) IMPLANT
SUT SILK 3-0 18XBRD TIE 12 (SUTURE) IMPLANT
SUT TEM PAC WIRE 2 0 SH (SUTURE) IMPLANT
SUT VIC AB 2-0 CTX 36 (SUTURE) IMPLANT
SUT VIC AB 2-0 UR6 27 (SUTURE) IMPLANT
SUT VIC AB 3-0 SH 8-18 (SUTURE) ×2 IMPLANT
SUT VICRYL 2 TP 1 (SUTURE) IMPLANT
SYRINGE 10CC LL (SYRINGE) ×2 IMPLANT
SYSTEM SAHARA CHEST DRAIN ATS (WOUND CARE) ×2 IMPLANT
TAPE CLOTH SURG 4X10 WHT LF (GAUZE/BANDAGES/DRESSINGS) ×1 IMPLANT
TOWEL OR 17X24 6PK STRL BLUE (TOWEL DISPOSABLE) ×2 IMPLANT
TOWEL OR 17X26 10 PK STRL BLUE (TOWEL DISPOSABLE) ×2 IMPLANT
TRAY FOLEY IC TEMP SENS 14FR (CATHETERS) ×2 IMPLANT
TROCAR XCEL BLADELESS 5X75MML (TROCAR) ×2 IMPLANT
TROCAR XCEL NON-BLD 11X100MML (ENDOMECHANICALS) ×4 IMPLANT
TUBE SUCT INTRACARD DLP 20F (MISCELLANEOUS) ×2 IMPLANT
TUNNELER SHEATH ON-Q 11GX8 (MISCELLANEOUS) ×1 IMPLANT
UNDERPAD 30X30 INCONTINENT (UNDERPADS AND DIAPERS) ×2 IMPLANT
WATER STERILE IRR 1000ML POUR (IV SOLUTION) ×4 IMPLANT
WIRE BENTSON .035X145CM (WIRE) ×2 IMPLANT

## 2012-02-21 NOTE — Interval H&P Note (Signed)
History and Physical Interval Note:  02/21/2012 7:42 AM  Matthew Mclean  has presented today for surgery, with the diagnosis of MITRAL REGURGITATION  The various methods of treatment have been discussed with the patient and family. After consideration of risks, benefits and other options for treatment, the patient has consented to  Procedure(s) (LRB): MINIMALLY INVASIVE MITRAL VALVE REPAIR (MVR) (Right) as a surgical intervention .  The patients' history has been reviewed, patient examined, no change in status, stable for surgery.  I have reviewed the patients' chart and labs.  Questions were answered to the patient's satisfaction.     [name] H

## 2012-02-21 NOTE — Anesthesia Postprocedure Evaluation (Signed)
  Anesthesia Post-op Note  Patient: Matthew Mclean  Procedure(s) Performed: Procedure(s) (LRB): MINIMALLY INVASIVE MITRAL VALVE REPAIR (MVR) (Right)  Patient Location: ICU  Anesthesia Type: General  Level of Consciousness: sedated  Airway and Oxygen Therapy: Patient remains intubated per anesthesia plan  Post-op Pain: none  Post-op Assessment: Post-op Vital signs reviewed, Patient's Cardiovascular Status Stable, Respiratory Function Stable and Patent Airway  Post-op Vital Signs: Reviewed and stable  Complications: No apparent anesthesia complications

## 2012-02-21 NOTE — Procedures (Signed)
Extubation Procedure Note  Patient Details:   Name: Matthew Mclean DOB: [birth year] MRN: [id number]   Airway Documentation:  Airway 8 mm (Active)  Secured at (cm) 24 cm 02/21/2012  6:17 PM  Measured From Lips 02/21/2012  6:17 PM  Secured Location Right 02/21/2012  6:17 PM  Secured By Pink Tape 02/21/2012  6:17 PM  Site Condition Dry 02/21/2012  6:17 PM    Evaluation  O2 sats: stable throughout Complications: No apparent complications Patient did tolerate procedure well. Bilateral Breath Sounds: Clear;Diminished   Yes Pt achieved .5mL with vital capacity, -20 with NIF and positive for cuff leak. Pt extubated to 4lpm [place], achieved around on IS and was oriented to name and place. Pt tolerated procedure. RT will continue to monitor.    Parke Poisson 02/21/2012, 7:02 PM

## 2012-02-21 NOTE — Preoperative (Signed)
Beta Blockers   Reason not to administer Beta Blockers:Not Applicable 

## 2012-02-21 NOTE — Addendum Note (Signed)
Addendum  created 02/21/12 1632 by Elon Alas, CRNA   Modules edited:Anesthesia LDA

## 2012-02-21 NOTE — Brief Op Note (Addendum)
                   [address]            [name] [address]          [phone]    02/21/2012  1:54 PM  PATIENT:  Matthew Mclean  56 y.o. 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

## 2012-02-21 NOTE — Op Note (Addendum)
CARDIOTHORACIC SURGERY OPERATIVE NOTE  Date of Procedure:  02/21/2012  Preoperative Diagnosis: Severe Mitral Regurgitation  Postoperative Diagnosis: Same  Procedure:   Minimally-Invasive Mitral Valve Repair  Complex valvuloplasty including triangular resection of posterior leaflet with sliding-folding plasty  Goretex neocord replacement x 4  Sorin Memo 3D Ring Annuloplasty (size 36mm, catalog [id number], serial # [id number])   Surgeon: [name]. Cornelius Moras, MD  Assistant: Gershon Crane, PA-C  Anesthesia: Jarvis Morgan. 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 The patient states that his symptoms have been vague in onset and severity, but he has definitely noticed a tendency towards worsening fatigue. He used to exercise quite regularly and he had to quit running do to severe fatigue and exertional shortness of breath. He was noted to have a heart murmur on physical exam by his primary care physician and an echocardiogram was performed demonstrating mitral valve prolapse with mitral regurgitation. The patient was referred to Dr. Allyson Sabal and subsequently underwent transesophageal echocardiogram confirming the presence of mitral valve prolapse with severe mitral regurgitation. The patient was referred to consider elective surgical intervention.  Cardiac catheterization reveals no significant coronary artery disease.    DETAILS OF THE OPERATIVE PROCEDURE  The patient is brought to the operating room on the above mentioned date and central monitoring was  established by the anesthesia team including placement of Swan-Ganz catheter through the left internal jugular vein.  A radial arterial line is placed. The patient is placed in the supine position on the operating table.  Intravenous antibiotics are administered. General endotracheal anesthesia is induced uneventfully. The patient is initially intubated using a dual lumen endotracheal tube.  A Foley catheter is placed.  Baseline transesophageal echocardiogram was performed.  Findings were notable for bileaflet prolapse of the mitral valve with severe mitral regurgitation.  There was normal LV systolic function.  No other significant abnormalities were noted.  A soft roll is placed behind the patient's left scapula and the neck gently extended and turned to the left.   The patient's right neck, chest, abdomen, both groins, and both lower extremities are prepared and draped in a sterile manner. A time out procedure is performed.  A small incision is made in the right inguinal crease and the anterior surface of the right common femoral artery and right common femoral vein are identified.  A right miniature anterolateral thoracotomy incision is performed. The incision is placed just lateral to and superior to the right nipple. The pectoralis major muscle is retracted medially and completely preserved. The right pleural space is entered through the 3rd intercostal space. A soft tissue retractor is placed.  Two 11 mm ports are placed through separate stab incisions inferiorly. The right pleural space is insufflated continuously with carbon dioxide gas through the posterior port during the remainder of the operation.  A pledgeted sutures placed through the dome of the right hemidiaphragm and retracted inferiorly to facilitate exposure.  A longitudinal incision is made in the pericardium 3 cm anterior to the phrenic nerve and silk traction sutures are placed on either side of the incision for exposure.  The  patient is placed  in Trendelenburg position. The right internal jugular vein is cannulated with Seldinger technique and a guidewire advanced into the right atrium. The patient is heparinized systemically. The right internal jugular vein is cannulated with a 14 [place] pediatric femoral venous cannula. Pursestring sutures are placed on the anterior surface of the right common femoral vein and right common femoral artery. The right common femoral vein is cannulated with the Seldinger technique and a guidewire is advanced under transesophageal echocardiogram guidance through the right atrium. The femoral vein is cannulated with a long 22 French femoral venous cannula. The right common femoral artery is cannulated with Seldinger technique and a flexible guidewire is advanced until it can be appreciated intraluminally in the descending thoracic aorta on transesophageal echocardiogram. The femoral artery is cannulated with an 18 French femoral arterial cannula.  Adequate heparinization is verified.      The entire pre-bypass portion of the operation was notable for stable hemodynamics.  Cardiopulmonary bypass was begun.  Vacuum assist venous drainage is utilized. The incision in the pericardium is extended in both directions. Venous drainage and exposure are notably excellent. A retrograde cardioplegia cannula is placed through the right atrium into the coronary sinus using transesophageal echocardiogram guidance.  An antegrade cardioplegia cannula is placed in the ascending aorta.    The patient is cooled to 28C systemic temperature.  The aortic cross clamp is applied and cold blood cardioplegia is delivered initially in an antegrade fashion through the aortic root.  Supplemental cardioplegia is given retrograde through the coronary sinus catheter. The initial cardioplegic arrest is rapid with early diastolic arrest.  Repeat doses of cardioplegia are administered intermittently every 20 to 30 minutes throughout the  entire cross clamp portion of the operation through the aortic root and through the coronary sinus catheter in order to maintain completely flat electrocardiogram.  Myocardial protection was felt to be excellent.  A left atriotomy incision was performed through the interatrial groove and extended partially across the back wall of the left atrium after opening the oblique sinus inferiorly.  The mitral valve is exposed using a self-retaining retractor.  The mitral valve was inspected and notable for billowing bileaflet prolapse with thickened, redundant leaflet tissue.  There were no areas of flail leaflet and no ruptured cords, but the cords to both P2 and P3 segment of the posterior leaflet were elongated. P3 was very tall with excessive tissue. The anterior leaflet was prolapsing but to a much less significant degree. The subvalvular apparatus was otherwise intact. There was no calcification. Gross anatomical findings were consistent with forme fruste variant of Barlow's disease.  Interrupted 2-0 Ethibond horizontal mattress sutures are placed circumferentially around the entire mitral valve annulus. The sutures will ultimately be utilized for ring annuloplasty, and at this juncture there are utilized to suspend the valve symmetrically.  Initially 2 pairs of artificial Gore-Tex cords were placed into the heads of both the anterior and posterior papillary muscles. CV 4 Gore-Tex pledgeted sutures were utilized and each suture was tied. The individual cords were not placed through the corresponding leaflets at this time but saved until after ring annuloplasty completed.  The tall redundant P3 segment was repaired using a targeted triangular resection with sliding folding plasty.  The excessive height of the P3 segment was corrected by excising triangular segments of the tissue prior to reattaching it to the posterior annulus. The leaflets were reattached to the posterior annulus with 2 layer closure of running  4-0 Prolene suture.  The vertical defect was closed  with interrupted CV 5 Gore-Tex sutures.  There were 2 large clefts in the P2 segment of the posterior leaflet which were closed with everting 4-0 Prolene sutures.   The valve was tested with saline and appeared competent even without ring annuloplasty complete. The valve was sized to a 36 mm annuloplasty ring, based upon the transverse distance between the left and right commissures and the height of the anterior leaflet, corresponding to a size somewhat larger than the overall surface area of the anterior leaflet.  A Sorin Memo 3D annuloplasty ring (size 36mm, catalog [id number], serial [id number]) was secured in place uneventfully. The valve was tested with saline and appeared competent.    The individual limbs of the Goretex neocords were retrieved from the LV chamber, woven into the posterior leaflet beginning at the free margin where they were placed from the ventricular surface to the atrial surface, and then woven in a diamond shaped fashion towards the posterior mitral annulus.  The Goretex sutures were then tied while the LV was distended with saline so as to adjust the length of the neocords to the appropriate length.  The valve is again tested with saline and appears to be perfectly competent with a broad symmetrical line of coaptation of the anterior and posterior leaflet. There is no residual leak. There was a broad, symmetrical line of coaptation of the anterior and posterior leaflet which was confirmed using the blue ink test.  Rewarming is begun.  The atriotomy was closed using a 2-layer closure of running 3-0 Prolene suture after placing a sump drain across the mitral valve to serve as a left ventricular vent.  One final dose of warm retrograde "hot shot" cardioplegia was administered retrograde through the coronary sinus catheter while all air was evacuated through the aortic root.  The aortic cross clamp was removed after a total cross clamp  time of 195 minutes.  Epicardial pacing wires are fixed to the inferior wall of the right ventricule and to the right atrial appendage. The patient is rewarmed to 37C temperature. The left ventricular vent is removed.  The patient is ventilated and flow volumes turndown while the mitral valve repair is inspected using transesophageal echocardiogram. The valve repair appears intact with no residual leak. The antegrade cardioplegia cannula is now removed. The patient is weaned and disconnected from cardiopulmonary bypass.  The patient's rhythm at separation from bypass was AV paced.  The patient was weaned from bypass without any inotropic support. Total cardiopulmonary bypass time for the operation was 241 minutes.  Followup transesophageal echocardiogram performed after separation from bypass revealed a well-seated annuloplasty ring in the mitral position with a normal functioning mitral valve. There was no residual leak.  Left ventricular function was unchanged from preoperatively.  There was no sign of any systolic anterior motion of the mitral valve.  The mean gradient across the mitral valve was estimated to be 4 mmHg.  The femoral arterial and venous cannulae were removed uneventfully. There was a palpable pulse in the distal right common femoral artery after removal of the cannula. Protamine was administered to reverse the anticoagulation. The right internal jugular cannula was removed and manual pressure held on the neck for 15 minutes.  Single lung ventilation was begun. The atriotomy closure was inspected for hemostasis. The pericardial sac was drained using a 28 French Bard drain placed through the anterior port incision.  The pericardium was closed using a patch of core matrix bovine submucosal tissue patch. The right pleural space is irrigated  with saline solution and inspected for hemostasis. The right pleural space was drained using a 28 French Bard drain placed through the posterior port  incision. The miniature thoracotomy incision was closed in multiple layers in routine fashion. The right groin incision was inspected for hemostasis and closed in multiple layers in routine fashion.  The patient tolerated the procedure well.  The patient was reintubated using a single lumen endotracheal tube and subsequently transported to the surgical intensive care unit in stable condition. There were no intraoperative complications. All sponge instrument and needle counts are verified correct at completion of the operation.   The post-bypass portion of the operation was notable for stable rhythm and hemodynamics.   No blood products were administered during the operation.   [name]. Cornelius Moras MD 02/21/2012 4:42 PM

## 2012-02-21 NOTE — Anesthesia Preprocedure Evaluation (Addendum)
Anesthesia Evaluation  Patient identified by MRN, date of birth, ID band Patient awake    Reviewed: Allergy & Precautions, H&P , NPO status , Patient's Chart, lab work & pertinent test results, reviewed documented beta blocker date and time   Airway Mallampati: II TM Distance: >3 FB Neck ROM: full    Dental  (+) Dental Advisory Given and Teeth Intact   Pulmonary shortness of breath,  breath sounds clear to auscultation        Cardiovascular hypertension, Pt. on home beta blockers + Valvular Problems/Murmurs MR Rate:Bradycardia  Sinus bradycardia with PACs   Neuro/Psych    GI/Hepatic GERD-  ,  Endo/Other  Hypothyroidism   Renal/GU      Musculoskeletal   Abdominal Normal abdominal exam  (+)   Peds  Hematology   Anesthesia Other Findings   Reproductive/Obstetrics                         Anesthesia Physical Anesthesia Plan  ASA: III  Anesthesia Plan: General   Post-op Pain Management:    Induction: Intravenous  Airway Management Planned: Oral ETT  Additional Equipment: Arterial line, CVP, PA Cath and TEE  Intra-op Plan:   Post-operative Plan: Post-operative intubation/ventilation  Informed Consent: I have reviewed the patients History and Physical, chart, labs and discussed the procedure including the risks, benefits and alternatives for the proposed anesthesia with the patient or authorized representative who has indicated his/her understanding and acceptance.   Dental advisory given  Plan Discussed with: CRNA, Anesthesiologist and Surgeon  Anesthesia Plan Comments:         Anesthesia Quick Evaluation

## 2012-02-21 NOTE — Anesthesia Procedure Notes (Signed)
Procedure Name: Intubation Date/Time: 02/21/2012 8:05 AM Performed by: Elon Alas Pre-anesthesia Checklist: Patient identified, Emergency Drugs available, Suction available, Patient being monitored and Timeout performed Patient Re-evaluated:Patient Re-evaluated prior to inductionOxygen Delivery Method: Circle system utilized Preoxygenation: Pre-oxygenation with 100% oxygen Intubation Type: IV induction and Cricoid Pressure applied Ventilation: Mask ventilation without difficulty Laryngoscope Size: Mac and 4 Grade View: Grade I Tube type: Oral Endobronchial tube: Double lumen EBT Number of attempts: 1 Airway Equipment and Method: Stylet Placement Confirmation: positive ETCO2,  ETT inserted through vocal cords under direct vision and breath sounds checked- equal and bilateral Tube secured with: Tape Dental Injury: Teeth and Oropharynx as per pre-operative assessment

## 2012-02-21 NOTE — Progress Notes (Signed)
S/p mitral repair BP 102/60  Pulse 48  Temp(Src) 95.7 F (35.4 C) (Oral)  Resp 12  Ht 5\' 11"  (1.803 m)  Wt 197 lb (89.359 kg)  BMI 27.48 kg/m2  SpO2 100%  Intake/Output Summary (Last 24 hours) at 02/21/12 1740 Last data filed at 02/21/12 1700  Gross per 24 hour  Intake 3026.4 ml  Output   2300 ml  Net  726.4 ml   Minimal CT output Doing well early postop

## 2012-02-21 NOTE — OR Nursing (Signed)
Made call to volunteer desk to inform family patient is off pump at 1450. Made first call to 2300 SICU at 1452. Made second call to 2300 SICU at 1515.

## 2012-02-21 NOTE — Addendum Note (Signed)
Addendum  created 02/21/12 1632 by Dorothe Elmore Brown Rendon Howell, CRNA   Modules edited:Anesthesia LDA    

## 2012-02-21 NOTE — Transfer of Care (Signed)
Immediate Anesthesia Transfer of Care Note  Patient: Matthew Mclean  Procedure(s) Performed: Procedure(s) (LRB): MINIMALLY INVASIVE MITRAL VALVE REPAIR (MVR) (Right)  Patient Location: SICU  Anesthesia Type: General  Level of Consciousness: sedated and Patient remains intubated per anesthesia plan  Airway & Oxygen Therapy: Patient remains intubated per anesthesia plan and Patient placed on Ventilator (see vital sign flow sheet for setting)  Post-op Assessment: Post -op Vital signs reviewed and stable  Post vital signs: Reviewed and stable  Complications: No apparent anesthesia complications

## 2012-02-22 ENCOUNTER — Encounter (HOSPITAL_COMMUNITY): Payer: Self-pay | Admitting: Thoracic Surgery (Cardiothoracic Vascular Surgery)

## 2012-02-22 ENCOUNTER — Inpatient Hospital Stay (HOSPITAL_COMMUNITY): Payer: BC Managed Care – PPO

## 2012-02-22 LAB — BASIC METABOLIC PANEL
BUN: 15 mg/dL (ref 6–23)
CO2: 22 mEq/L (ref 19–32)
Chloride: 104 mEq/L (ref 96–112)
Creatinine, Ser: 0.89 mg/dL (ref 0.50–1.35)
Glucose, Bld: 123 mg/dL — ABNORMAL HIGH (ref 70–99)
Potassium: 4.2 mEq/L (ref 3.5–5.1)

## 2012-02-22 LAB — CBC
HCT: 30.6 % — ABNORMAL LOW (ref 39.0–52.0)
Hemoglobin: 10.7 g/dL — ABNORMAL LOW (ref 13.0–17.0)
MCV: 86 fL (ref 78.0–100.0)
WBC: 5 10*3/uL (ref 4.0–10.5)

## 2012-02-22 LAB — GLUCOSE, CAPILLARY
Glucose-Capillary: 114 mg/dL — ABNORMAL HIGH (ref 70–99)
Glucose-Capillary: 108 mg/dL — ABNORMAL HIGH (ref 70–99)

## 2012-02-22 MED ORDER — SODIUM CHLORIDE 0.9 % IV SOLN: 250.0000 mL | INTRAVENOUS | Status: DC | PRN

## 2012-02-22 MED ORDER — ATORVASTATIN CALCIUM 20 MG PO TABS
20.0000 mg | ORAL_TABLET | Freq: Every day | ORAL | Status: DC
  Administered 2012-02-22 – 2012-02-24 (×3): 20 mg via ORAL
  Filled 2012-02-22 (×4): qty 1

## 2012-02-22 MED ORDER — WARFARIN SODIUM 2.5 MG PO TABS
2.5000 mg | ORAL_TABLET | Freq: Every day | ORAL | Status: DC
  Administered 2012-02-22 – 2012-02-24 (×3): 2.5 mg via ORAL
  Filled 2012-02-22 (×4): qty 1

## 2012-02-22 MED ORDER — FUROSEMIDE 10 MG/ML IJ SOLN
20.0000 mg | Freq: Four times a day (QID) | INTRAMUSCULAR | Status: AC
  Administered 2012-02-22 (×2): 20 mg via INTRAVENOUS
  Filled 2012-02-22 (×3): qty 2

## 2012-02-22 MED ORDER — TRAMADOL HCL 50 MG PO TABS
50.0000 mg | ORAL_TABLET | ORAL | Status: DC | PRN
  Administered 2012-02-22 – 2012-02-25 (×4): 100 mg via ORAL
  Filled 2012-02-22 (×4): qty 2

## 2012-02-22 MED ORDER — SODIUM CHLORIDE 0.9 % IJ SOLN: 3.0000 mL | INTRAMUSCULAR | Status: DC | PRN

## 2012-02-22 MED ORDER — MOVING RIGHT ALONG BOOK
Freq: Once | Status: AC
  Administered 2012-02-22: 09:00:00
  Filled 2012-02-22: qty 1

## 2012-02-22 MED ORDER — ALPRAZOLAM 0.25 MG PO TABS
0.2500 mg | ORAL_TABLET | Freq: Four times a day (QID) | ORAL | Status: DC | PRN
  Administered 2012-02-24: 0.25 mg via ORAL
  Filled 2012-02-22: qty 1

## 2012-02-22 MED ORDER — SODIUM CHLORIDE 0.9 % IJ SOLN
3.0000 mL | Freq: Two times a day (BID) | INTRAMUSCULAR | Status: DC
  Administered 2012-02-22 – 2012-02-24 (×6): 3 mL via INTRAVENOUS

## 2012-02-22 MED ORDER — POTASSIUM CHLORIDE 10 MEQ/50ML IV SOLN
10.0000 meq | INTRAVENOUS | Status: AC
  Administered 2012-02-22 (×2): 10 meq via INTRAVENOUS

## 2012-02-22 MED ORDER — METOPROLOL TARTRATE 12.5 MG HALF TABLET
12.5000 mg | ORAL_TABLET | Freq: Two times a day (BID) | ORAL | Status: DC
  Filled 2012-02-22 (×2): qty 1

## 2012-02-22 MED ORDER — FUROSEMIDE 40 MG PO TABS
40.0000 mg | ORAL_TABLET | Freq: Two times a day (BID) | ORAL | Status: AC
  Administered 2012-02-23 – 2012-02-24 (×4): 40 mg via ORAL
  Filled 2012-02-22 (×4): qty 1

## 2012-02-22 MED ORDER — WARFARIN - PHYSICIAN DOSING INPATIENT
Freq: Every day | Status: DC
  Administered 2012-02-22: 18:00:00

## 2012-02-22 MED ORDER — POTASSIUM CHLORIDE CRYS ER 20 MEQ PO TBCR
20.0000 meq | EXTENDED_RELEASE_TABLET | Freq: Two times a day (BID) | ORAL | Status: DC
  Filled 2012-02-22 (×2): qty 1

## 2012-02-22 MED ORDER — OLMESARTAN 10 MG HALF TABLET
10.0000 mg | ORAL_TABLET | Freq: Every day | ORAL | Status: DC
  Administered 2012-02-23 – 2012-02-24 (×2): 10 mg via ORAL
  Filled 2012-02-22 (×3): qty 1

## 2012-02-22 MED ORDER — ASPIRIN EC 81 MG PO TBEC
81.0000 mg | DELAYED_RELEASE_TABLET | Freq: Every day | ORAL | Status: DC
  Administered 2012-02-22 – 2012-02-24 (×3): 81 mg via ORAL
  Filled 2012-02-22 (×4): qty 1

## 2012-02-22 MED ORDER — NEBIVOLOL HCL 5 MG PO TABS
5.0000 mg | ORAL_TABLET | Freq: Every day | ORAL | Status: DC
  Administered 2012-02-23 – 2012-02-24 (×2): 5 mg via ORAL
  Filled 2012-02-22 (×3): qty 1

## 2012-02-22 MED FILL — Magnesium Sulfate Inj 50%: INTRAMUSCULAR | Qty: 10 | Status: AC

## 2012-02-22 MED FILL — Potassium Chloride Inj 2 mEq/ML: INTRAVENOUS | Qty: 40 | Status: AC

## 2012-02-22 NOTE — Consult Note (Signed)
Reason for Consult: post op    Referring Physician: Dr. Greta Doom ROHAIL KLEES is an 56 y.o. male.    Chief Complaint: Severe MR/post op MV repair with valvuloplasty with 36mm Sorin memo 3-D ring annuloplasty.   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  His cardiac cath 02/06/2012, revealed normal coronary arteries, normal LV function, mildly elevated rt. Sided pressures, and normal thoracic aorta.  He was referred to Dr. Cornelius Moras for surgery which occurred 02/21/12.  MINIMALLY INVASIVE MITRAL VALVE REPAIR (COMPLEX VALVULOPLASTY WITH 36mm SORIN MEMO 3-D RING ANNULOPLASTY.  Today pt. Is atrially pacing. And stable-POD # 1.   Past Medical History  Diagnosis Date  . Hypertension   . Shortness of breath     on exertion  . Hypothyroidism   . MVP (mitral valve prolapse)   . 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 Heart murmur   . GERD (gastroesophageal reflux disease)   . Depression   . Anxiety   . Hyperlipemia   . S/P mitral valve repair 02/21/2012    Complex valvuloplasty with 36 mm Sorin Memo 3D ring annuloplasty via right mini thoracotomy    Past Surgical History  Procedure Date  . Tee without cardioversion 01/12/2012    Procedure: TRANSESOPHAGEAL ECHOCARDIOGRAM (TEE);  Surgeon: Thurmon Fair, MD;  Location: [facility] ENDOSCOPY;  Service: Cardiovascular;  Laterality: N/A;  3d echo  . Inguinal hernia repair     Right  . Hernia repair     inguinal x 2  Right and Left  . Cardiac catheterization   . Tonsillectomy     Family History  Problem Relation Age of Onset  . Cancer Paternal Grandmother   . Anesthesia problems Neg Hx    Social History:  reports that he has never smoked. He has never used smokeless tobacco. He reports that he does not drink alcohol or use illicit drugs.  Allergies:  Allergies  Allergen Reactions  . Penicillins Other (See Comments)    Unknown reaction.  Mother told him  he was allergic.    Medications Prior to Admission  Medication Sig Dispense Refill  . amiodarone (PACERONE) 200 MG tablet Take 1 tablet (200 mg total) by mouth 2 (two) times daily. Begin 7 days prior to surgery.  14 tablet  0  . chlorpheniramine (CHLOR-TRIMETON) 4 MG tablet Take 4 mg by mouth 2 (two) times daily as needed.      Marland Kitchen ibuprofen (ADVIL,MOTRIN) 200 MG tablet Take 200 mg by mouth 2 (two) times daily.       Marland Kitchen levothyroxine (SYNTHROID, LEVOTHROID) 125 MCG tablet Take 125 mcg by mouth daily.      . nebivolol (BYSTOLIC) 5 MG tablet Take 5 mg by mouth daily.      . rosuvastatin (CRESTOR) 10 MG tablet Take 10 mg by mouth daily.      . valsartan (DIOVAN) 320 MG tablet Take 320 mg by mouth daily.      Marland Kitchen docusate sodium (COLACE) 100 MG capsule Take 100 mg by mouth daily.        Results for orders placed during the hospital encounter of 02/21/12 (from the past 48 hour(s))  POCT I-STAT 4, (NA,K, GLUC, HGB,HCT)     Status: Normal   Collection Time   02/21/12  8:14 AM      Component Value Range Comment   Sodium 139  135 -  145 (mEq/L)    Potassium 4.0  3.5 - 5.1 (mEq/L)    Glucose, Bld 95  70 - 99 (mg/dL)    HCT 16.1  09.6 - 04.5 (%)    Hemoglobin 14.6  13.0 - 17.0 (g/dL)   POCT I-STAT 4, (NA,K, GLUC, HGB,HCT)     Status: Abnormal   Collection Time   02/21/12 10:02 AM      Component Value Range Comment   Sodium 138  135 - 145 (mEq/L)    Potassium 4.2  3.5 - 5.1 (mEq/L)    Glucose, Bld 100 (*) 70 - 99 (mg/dL)    HCT 40.9 (*) 81.1 - 52.0 (%)    Hemoglobin 12.9 (*) 13.0 - 17.0 (g/dL)   POCT I-STAT 4, (NA,K, GLUC, HGB,HCT)     Status: Abnormal   Collection Time   02/21/12 10:18 AM      Component Value Range Comment   Sodium 129 (*) 135 - 145 (mEq/L)    Potassium 4.2  3.5 - 5.1 (mEq/L)    Glucose, Bld 81  70 - 99 (mg/dL)    HCT 91.4 (*) 78.2 - 52.0 (%)    Hemoglobin 9.9 (*) 13.0 - 17.0 (g/dL)   POCT I-STAT 3, BLOOD GAS (G3+)     Status: Abnormal   Collection Time   02/21/12 10:21 AM       Component Value Range Comment   pH, Arterial 7.362  7.350 - 7.450     pCO2 arterial 41.6  35.0 - 45.0 (mmHg)    pO2, Arterial 376.0 (*) 80.0 - 100.0 (mmHg)    Bicarbonate 23.6  20.0 - 24.0 (mEq/L)    TCO2 25  0 - 100 (mmol/L)    O2 Saturation 100.0      Acid-base deficit 2.0  0.0 - 2.0 (mmol/L)    Sample type ARTERIAL     POCT I-STAT 4, (NA,K, GLUC, HGB,HCT)     Status: Abnormal   Collection Time   02/21/12 11:19 AM      Component Value Range Comment   Sodium 135  135 - 145 (mEq/L)    Potassium 5.3 (*) 3.5 - 5.1 (mEq/L)    Glucose, Bld 143 (*) 70 - 99 (mg/dL)    HCT 95.6 (*) 21.3 - 52.0 (%)    Hemoglobin 11.2 (*) 13.0 - 17.0 (g/dL)   POCT I-STAT 4, (NA,K, GLUC, HGB,HCT)     Status: Abnormal   Collection Time   02/21/12 12:21 PM      Component Value Range Comment   Sodium 137  135 - 145 (mEq/L)    Potassium 5.4 (*) 3.5 - 5.1 (mEq/L)    Glucose, Bld 139 (*) 70 - 99 (mg/dL)    HCT 08.6 (*) 57.8 - 52.0 (%)    Hemoglobin 10.9 (*) 13.0 - 17.0 (g/dL)   POCT I-STAT 4, (NA,K, GLUC, HGB,HCT)     Status: Abnormal   Collection Time   02/21/12  1:24 PM      Component Value Range Comment   Sodium 138  135 - 145 (mEq/L)    Potassium 4.5  3.5 - 5.1 (mEq/L)    Glucose, Bld 118 (*) 70 - 99 (mg/dL)    HCT 46.9 (*) 62.9 - 52.0 (%)    Hemoglobin 10.9 (*) 13.0 - 17.0 (g/dL)   HEMOGLOBIN AND HEMATOCRIT, BLOOD     Status: Abnormal   Collection Time   02/21/12  1:35 PM      Component Value Range Comment   Hemoglobin  10.9 (*) 13.0 - 17.0 (g/dL)    HCT 16.1 (*) 09.6 - 52.0 (%)   PLATELET COUNT     Status: Abnormal   Collection Time   02/21/12  1:35 PM      Component Value Range Comment   Platelets 92 (*) 150 - 400 (K/uL) PLATELET COUNT CONFIRMED BY SMEAR  POCT I-STAT 4, (NA,K, GLUC, HGB,HCT)     Status: Abnormal   Collection Time   02/21/12  2:46 PM      Component Value Range Comment   Sodium 141  135 - 145 (mEq/L)    Potassium 3.7  3.5 - 5.1 (mEq/L)    Glucose, Bld 95  70 - 99 (mg/dL)    HCT 04.5 (*)  40.9 - 52.0 (%)    Hemoglobin 10.9 (*) 13.0 - 17.0 (g/dL)   POCT I-STAT 3, BLOOD GAS (G3+)     Status: Abnormal   Collection Time   02/21/12  2:51 PM      Component Value Range Comment   pH, Arterial 7.379  7.350 - 7.450     pCO2 arterial 37.4  35.0 - 45.0 (mmHg)    pO2, Arterial 315.0 (*) 80.0 - 100.0 (mmHg)    Bicarbonate 22.1  20.0 - 24.0 (mEq/L)    TCO2 23  0 - 100 (mmol/L)    O2 Saturation 100.0      Acid-base deficit 3.0 (*) 0.0 - 2.0 (mmol/L)    Sample type ARTERIAL     POCT I-STAT 4, (NA,K, GLUC, HGB,HCT)     Status: Abnormal   Collection Time   02/21/12  4:07 PM      Component Value Range Comment   Sodium 141  135 - 145 (mEq/L)    Potassium 3.7  3.5 - 5.1 (mEq/L)    Glucose, Bld 100 (*) 70 - 99 (mg/dL)    HCT 81.1 (*) 91.4 - 52.0 (%)    Hemoglobin 12.2 (*) 13.0 - 17.0 (g/dL)   POCT I-STAT 3, BLOOD GAS (G3+)     Status: Abnormal   Collection Time   02/21/12  4:11 PM      Component Value Range Comment   pH, Arterial 7.356  7.350 - 7.450     pCO2 arterial 39.4  35.0 - 45.0 (mmHg)    pO2, Arterial 103.0 (*) 80.0 - 100.0 (mmHg)    Bicarbonate 22.5  20.0 - 24.0 (mEq/L)    TCO2 24  0 - 100 (mmol/L)    O2 Saturation 98.0      Acid-base deficit 3.0 (*) 0.0 - 2.0 (mmol/L)    Patient temperature 35.2 C      Collection site RADIAL, ALLEN'S TEST ACCEPTABLE      Drawn by Operator      Sample type ARTERIAL     CBC     Status: Abnormal   Collection Time   02/21/12  4:12 PM      Component Value Range Comment   WBC 5.4  4.0 - 10.5 (K/uL)    RBC 4.19 (*) 4.22 - 5.81 (MIL/uL)    Hemoglobin 12.8 (*) 13.0 - 17.0 (g/dL)    HCT 78.2 (*) 95.6 - 52.0 (%)    MCV 85.7  78.0 - 100.0 (fL)    MCH 30.5  26.0 - 34.0 (pg)    MCHC 35.7  30.0 - 36.0 (g/dL)    RDW 21.3  08.6 - 57.8 (%)    Platelets 85 (*) 150 - 400 (K/uL) CONSISTENT WITH PREVIOUS RESULT  PROTIME-INR  Status: Abnormal   Collection Time   02/21/12  4:12 PM      Component Value Range Comment   Prothrombin Time 17.7 (*) 11.6 - 15.2  (seconds)    INR 1.43  0.00 - 1.49    APTT     Status: Normal   Collection Time   02/21/12  4:12 PM      Component Value Range Comment   aPTT 35  24 - 37 (seconds)   GLUCOSE, CAPILLARY     Status: Abnormal   Collection Time   02/21/12  5:23 PM      Component Value Range Comment   Glucose-Capillary 104 (*) 70 - 99 (mg/dL)   GLUCOSE, CAPILLARY     Status: Normal   Collection Time   02/21/12  6:01 PM      Component Value Range Comment   Glucose-Capillary 82  70 - 99 (mg/dL)   GLUCOSE, CAPILLARY     Status: Normal   Collection Time   02/21/12  6:45 PM      Component Value Range Comment   Glucose-Capillary 91  70 - 99 (mg/dL)   POCT I-STAT 3, BLOOD GAS (G3+)     Status: Abnormal   Collection Time   02/21/12  6:46 PM      Component Value Range Comment   pH, Arterial 7.296 (*) 7.350 - 7.450     pCO2 arterial 45.0  35.0 - 45.0 (mmHg)    pO2, Arterial 122.0 (*) 80.0 - 100.0 (mmHg)    Bicarbonate 22.0  20.0 - 24.0 (mEq/L)    TCO2 23  0 - 100 (mmol/L)    O2 Saturation 98.0      Acid-base deficit 4.0 (*) 0.0 - 2.0 (mmol/L)    Patient temperature 36.9 C      Sample type ARTERIAL     GLUCOSE, CAPILLARY     Status: Normal   Collection Time   02/21/12  7:42 PM      Component Value Range Comment   Glucose-Capillary 85  70 - 99 (mg/dL)    Comment 1 Notify RN     POCT I-STAT 3, BLOOD GAS (G3+)     Status: Abnormal   Collection Time   02/21/12  9:08 PM      Component Value Range Comment   pH, Arterial 7.305 (*) 7.350 - 7.450     pCO2 arterial 42.0  35.0 - 45.0 (mmHg)    pO2, Arterial 136.0 (*) 80.0 - 100.0 (mmHg)    Bicarbonate 20.8  20.0 - 24.0 (mEq/L)    TCO2 22  0 - 100 (mmol/L)    O2 Saturation 99.0      Acid-base deficit 5.0 (*) 0.0 - 2.0 (mmol/L)    Patient temperature 37.5 C      Sample type ARTERIAL     POCT I-STAT, CHEM 8     Status: Abnormal   Collection Time   02/21/12  9:11 PM      Component Value Range Comment   Sodium 139  135 - 145 (mEq/L)    Potassium 5.2 (*) 3.5 - 5.1 (mEq/L)     Chloride 110  96 - 112 (mEq/L)    BUN 18  6 - 23 (mg/dL)    Creatinine, Ser 4.54  0.50 - 1.35 (mg/dL)    Glucose, Bld 098 (*) 70 - 99 (mg/dL)    Calcium, Ion 1.19 (*) 1.12 - 1.32 (mmol/L)    TCO2 21  0 - 100 (mmol/L)    Hemoglobin  10.2 (*) 13.0 - 17.0 (g/dL)    HCT 16.1 (*) 09.6 - 52.0 (%)   CBC     Status: Abnormal   Collection Time   02/21/12 10:56 PM      Component Value Range Comment   WBC 5.2  4.0 - 10.5 (K/uL)    RBC 3.70 (*) 4.22 - 5.81 (MIL/uL)    Hemoglobin 10.7 (*) 13.0 - 17.0 (g/dL)    HCT 04.5 (*) 40.9 - 52.0 (%)    MCV 85.7  78.0 - 100.0 (fL)    MCH 28.9  26.0 - 34.0 (pg)    MCHC 33.8  30.0 - 36.0 (g/dL)    RDW 81.1  91.4 - 78.2 (%)    Platelets 88 (*) 150 - 400 (K/uL) CONSISTENT WITH PREVIOUS RESULT  MAGNESIUM     Status: Abnormal   Collection Time   02/21/12 10:56 PM      Component Value Range Comment   Magnesium 2.9 (*) 1.5 - 2.5 (mg/dL)   CREATININE, SERUM     Status: Normal   Collection Time   02/21/12 10:56 PM      Component Value Range Comment   Creatinine, Ser 0.94  0.50 - 1.35 (mg/dL)    GFR calc non Af Amer >90  >90 (mL/min)    GFR calc Af Amer >90  >90 (mL/min)   GLUCOSE, CAPILLARY     Status: Abnormal   Collection Time   02/21/12 11:34 PM      Component Value Range Comment   Glucose-Capillary 126 (*) 70 - 99 (mg/dL)   CBC     Status: Abnormal   Collection Time   02/22/12  3:53 AM      Component Value Range Comment   WBC 5.0  4.0 - 10.5 (K/uL)    RBC 3.56 (*) 4.22 - 5.81 (MIL/uL)    Hemoglobin 10.7 (*) 13.0 - 17.0 (g/dL)    HCT 95.6 (*) 21.3 - 52.0 (%)    MCV 86.0  78.0 - 100.0 (fL)    MCH 30.1  26.0 - 34.0 (pg)    MCHC 35.0  30.0 - 36.0 (g/dL)    RDW 08.6  57.8 - 46.9 (%)    Platelets 75 (*) 150 - 400 (K/uL) CONSISTENT WITH PREVIOUS RESULT  BASIC METABOLIC PANEL     Status: Abnormal   Collection Time   02/22/12  3:53 AM      Component Value Range Comment   Sodium 132 (*) 135 - 145 (mEq/L) DELTA CHECK NOTED   Potassium 4.2  3.5 - 5.1 (mEq/L)  DELTA CHECK NOTED   Chloride 104  96 - 112 (mEq/L)    CO2 22  19 - 32 (mEq/L)    Glucose, Bld 123 (*) 70 - 99 (mg/dL)    BUN 15  6 - 23 (mg/dL)    Creatinine, Ser 6.29  0.50 - 1.35 (mg/dL)    Calcium 7.3 (*) 8.4 - 10.5 (mg/dL)    GFR calc non Af Amer >90  >90 (mL/min)    GFR calc Af Amer >90  >90 (mL/min)   MAGNESIUM     Status: Normal   Collection Time   02/22/12  3:53 AM      Component Value Range Comment   Magnesium 2.4  1.5 - 2.5 (mg/dL)   GLUCOSE, CAPILLARY     Status: Abnormal   Collection Time   02/22/12  4:10 AM      Component Value Range Comment   Glucose-Capillary 114 (*) 70 -  99 (mg/dL)   GLUCOSE, CAPILLARY     Status: Normal   Collection Time   02/22/12  7:37 AM      Component Value Range Comment   Glucose-Capillary 92  70 - 99 (mg/dL)    Dg Chest Portable 1 View In Am  02/22/2012  *RADIOLOGY REPORT*  Clinical Data: Postop open heart surgery.  PORTABLE CHEST - 1 VIEW  Comparison: 02/21/2012  Findings: Interval removal of endotracheal tube and NG tube.  Swan- Ganz catheter and right chest tube remain in place, unchanged. Bibasilar atelectasis and cardiomegaly.  No visible effusions.  IMPRESSION: Interval extubation.  Bibasilar atelectasis.  Original Report Authenticated By: Cyndie Chime, M.D.   Dg Chest Portable 1 View  02/21/2012  *RADIOLOGY REPORT*  Clinical Data: CABG  PORTABLE CHEST - 1 VIEW  Comparison: 02/17/2012  Findings: There are no sternal wires.  Endotracheal tube tip is 5.3 cm from the carina.  NG tube is in the fundus of the stomach. Right thoracostomy tube in place without pneumothorax.  Scattered atelectasis over the right lung and at the left lung base. Pacemaker wires.  Left internal jugular vein introducer sheath and Swan-Ganz catheter tip in the central right pulmonary artery.  IMPRESSION: Post CABG.  Tubular devices in appropriate position.  Scattered atelectasis.  No pneumothorax or edema.  Original Report Authenticated By: Donavan Burnet, M.D.    ROS:  General:no recent colds or fevers, decreased energy Skin:no rashes or ulcers HEENT:no visual changes CV:+ intermittent chest pain with stress or exertion, + DOE.  PUL:no wheezing  + DOE  GI:No diarrhea constipation, or melena, no difficult swallowing GU:no hematuria, no dysuria ZO:XWRU joint pain Neuro:no syncope Endo:no diabetes, no thyroid disease   Blood pressure 103/66, pulse 70, temperature 98.8 F (37.1 C), temperature source Core (Comment), resp. rate 25, height 5\' 11"  (1.803 m), weight 91 kg (200 lb 9.9 oz), SpO2 99.00%. PE: General:Feels well no specific complaints, somewhat anxious that everything is going well, also stated his feet have not bothered him with pain and they were preop  Skin:Warm and dry. HEENT:normocephalic, sclera clear Neck:supple, IJ on st. Heart:S1S2 RRR ? Soft systolic murmur  1/6 sem LSB Lungs:clear without rales, rhonchi or wheezes Abd:+ BS, soft, non tender Ext:no edema, + pulses Neuro:alert and oriented X 3. MAE, follows commands.   PA pressures:  31/10  Previous CO/CI:   7.7/3.7   Assessment/Plan Patient Active Problem List  Diagnoses  . Hypertension  . Shortness of breath  . Hypothyroidism  . MVP (mitral valve prolapse)  . 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 Mitral valve disorders  . S/P mitral valve repair   PLAN:  Labs stable, BP stable on no pressors.  Atrially pacing without arrhythmias.Progressing well. Plan to diuresis, and begin coumadin.  Is amiodarone that was started 7 days prior to surgery. Was on Bystolic has an outpatient, now lopressor 12.5 BID.  INGOLD,LAURA R 02/22/2012, 9:46 AM   Patient seen and examined. Agree with assessment and plan. Day 1 s/p MVR complex valvuloplasty with triangular resection of posterior leaflet and 36 mm Sorin Memo 3D  ring annuloplasty.  Currently A paced. Hemodynamic stable off pressors. Notes mild dyspepsia.   Lennette Bihari, MD, [facility] 02/22/2012 3:32 PM

## 2012-02-22 NOTE — Progress Notes (Signed)
UR Completed. Mclean, Matthew Gorey F [phone]  

## 2012-02-22 NOTE — Progress Notes (Signed)
CARDIAC REHAB PHASE I   PRE:  Rate/Rhythm: 70 paced  BP:  Supine:   Sitting: 102/40  Standing:    SaO2: 96%RA  MODE:  Ambulation: 150 ft   POST:  Rate/Rhythem: 70 paced  BP:  Supine:   Sitting: 101/59  Standing:    SaO2: 92%RA 1340-1412 Pt very motivated to walk. Walked 150 ft on RA with rolling walker and asst x 1. Tolerated well. To recliner with call bell. Set up lunch for pt after walk. Discussed CRP 2 [employer]. Pt gave permission to refer. Sats good on RA.  Duanne Limerick

## 2012-02-22 NOTE — Progress Notes (Signed)
   CARDIOTHORACIC SURGERY PROGRESS NOTE   R1 Day Post-Op Procedure(s) (LRB): MINIMALLY INVASIVE MITRAL VALVE REPAIR (MVR) (Right)  Subjective: Doing well.  No pain.  No shortness of breath.  No nausea.  Objective: Vital signs: BP Readings from Last 1 Encounters:  02/22/12 107/67   Pulse Readings from Last 1 Encounters:  02/22/12 80   Resp Readings from Last 1 Encounters:  02/22/12 19   Temp Readings from Last 1 Encounters:  02/22/12 98.8 F (37.1 C) Core (Comment)    Hemodynamics: PAP: (17-42)/(8-28) 31/10 mmHg CO:  [3.5 L/min-7.8 L/min] 7.7 L/min CI:  [1.7 L/min/m2-3.7 L/min/m2] 3.4 L/min/m2  Physical Exam:  Rhythm:   sinus  Breath sounds: clear  Heart sounds:  RRR without murmur  Incisions:  Dressings dry and intact  Abdomen:  Soft, non distended, non tender  Extremities:  Warm, well perfused   Intake/Output from previous day: 05/07 0701 - 05/08 0700 In: 5517.7 [P.O.:410; I.V.:3027.7; Blood:410; NG/GT:30; IV Piggyback:1640] Out: 5100 [Urine:4495; Chest Tube:605] Intake/Output this shift: Total I/O In: 40 [I.V.:40] Out: 75 [Urine:75]  Lab Results:  [facility] 02/22/12 0353 02/21/12 2256  WBC 5.0 5.2  HGB 10.7* 10.7*  HCT 30.6* 31.7*  PLT 75* 88*   BMET:  Basename 02/22/12 0353 02/21/12 2256 02/21/12 2111  NA 132* -- 139  K 4.2 -- 5.2*  CL 104 -- 110  CO2 22 -- --  GLUCOSE 123* -- 120*  BUN 15 -- 18  CREATININE 0.89 0.94 --  CALCIUM 7.3* -- --    CBG (last 3)   Basename 02/22/12 0737 02/22/12 0410 02/21/12 2334  GLUCAP 92 114* 126*   ABG    Component Value Date/Time   PHART 7.305* 02/21/2012 2108   HCO3 20.8 02/21/2012 2108   TCO2 21 02/21/2012 2111   ACIDBASEDEF 5.0* 02/21/2012 2108   O2SAT 99.0 02/21/2012 2108   CXR: Clear with minimal atelectasis.  Looks good.  Assessment/Plan: S/P Procedure(s) (LRB): MINIMALLY INVASIVE MITRAL VALVE REPAIR (MVR) (Right)  Doing very well POD1 Expected post op acute blood loss anemia, mild,  stable Expected post op volume excess, moderate Postop thrombocytopenia   Mobilize  D/C lines  Diuresis  AAI pace for now  Start coumadin  Watch platelet count  Matthew Mclean H 02/22/2012 8:26 AM

## 2012-02-22 NOTE — Progress Notes (Signed)
Patient was transferred from Unit 2300 to 2000, after report was given to receiving RN.  Patient's chart, personal belongings, and medications were all sent with patient.  Keitha Butte, RN

## 2012-02-23 ENCOUNTER — Inpatient Hospital Stay (HOSPITAL_COMMUNITY): Payer: BC Managed Care – PPO

## 2012-02-23 DIAGNOSIS — IMO0001 Reserved for inherently not codable concepts without codable children: Secondary | ICD-10-CM

## 2012-02-23 DIAGNOSIS — I10 Essential (primary) hypertension: Secondary | ICD-10-CM | POA: Diagnosis present

## 2012-02-23 DIAGNOSIS — R011 Cardiac murmur, unspecified: Secondary | ICD-10-CM | POA: Diagnosis not present

## 2012-02-23 LAB — BASIC METABOLIC PANEL
BUN: 14 mg/dL (ref 6–23)
CO2: 25 mEq/L (ref 19–32)
Chloride: 102 mEq/L (ref 96–112)
GFR calc Af Amer: >90 mL/min (ref >90)
Potassium: 3.6 mEq/L (ref 3.5–5.1)

## 2012-02-23 LAB — CBC
HCT: 32.6 % — ABNORMAL LOW (ref 39.0–52.0)
Hemoglobin: 11.2 g/dL — ABNORMAL LOW (ref 13.0–17.0)
MCV: 86.2 fL (ref 78.0–100.0)
RBC: 3.78 MIL/uL — ABNORMAL LOW (ref 4.22–5.81)
RDW: 13.4 % (ref 11.5–15.5)
WBC: 7.1 10*3/uL (ref 4.0–10.5)

## 2012-02-23 MED ORDER — POTASSIUM CHLORIDE CRYS ER 20 MEQ PO TBCR
40.0000 meq | EXTENDED_RELEASE_TABLET | Freq: Once | ORAL | Status: AC
  Administered 2012-02-23: 40 meq via ORAL
  Filled 2012-02-23: qty 2

## 2012-02-23 MED ORDER — PATIENT'S GUIDE TO USING COUMADIN BOOK
Freq: Once | Status: AC
  Administered 2012-02-23: 18:00:00
  Filled 2012-02-23: qty 1

## 2012-02-23 MED ORDER — WARFARIN VIDEO
Freq: Once | Status: AC
  Administered 2012-02-24: 10:00:00

## 2012-02-23 MED ORDER — POTASSIUM CHLORIDE CRYS ER 20 MEQ PO TBCR
40.0000 meq | EXTENDED_RELEASE_TABLET | Freq: Two times a day (BID) | ORAL | Status: DC
  Administered 2012-02-23 – 2012-02-24 (×4): 40 meq via ORAL
  Filled 2012-02-23 (×6): qty 2

## 2012-02-23 MED FILL — Lidocaine HCl IV Inj 20 MG/ML: INTRAVENOUS | Qty: 5 | Status: AC

## 2012-02-23 MED FILL — Sodium Chloride Irrigation Soln 0.9%: Qty: 3000 | Status: AC

## 2012-02-23 MED FILL — Heparin Sodium (Porcine) Inj 1000 Unit/ML: INTRAMUSCULAR | Qty: 20 | Status: AC

## 2012-02-23 MED FILL — Electrolyte-R (PH 7.4) Solution: INTRAVENOUS | Qty: 4000 | Status: AC

## 2012-02-23 MED FILL — Mannitol IV Soln 20%: INTRAVENOUS | Qty: 500 | Status: AC

## 2012-02-23 MED FILL — Heparin Sodium (Porcine) Inj 1000 Unit/ML: INTRAMUSCULAR | Qty: 30 | Status: AC

## 2012-02-23 MED FILL — Sodium Chloride IV Soln 0.9%: INTRAVENOUS | Qty: 1000 | Status: AC

## 2012-02-23 MED FILL — Sodium Bicarbonate IV Soln 8.4%: INTRAVENOUS | Qty: 50 | Status: AC

## 2012-02-23 NOTE — Progress Notes (Signed)
  Echocardiogram 2D Echocardiogram has been performed.  Dorena Cookey 02/23/2012, 4:27 PM

## 2012-02-23 NOTE — Care Management Note (Signed)
    Page 1 of 1   02/23/2012     10:44:30 AM   CARE MANAGEMENT NOTE 02/23/2012  Patient:  ZOHAIR, EPP   Account Number:  [id number]  Date Initiated:  02/23/2012  Documentation initiated by:  Markasia Carrol  Subjective/Objective Assessment:   PT S/P MVR ON 02/21/12.  PTA, PT INDEPENDENT, LIVES WITH SPOUSE.     Action/Plan:   MET WITH PT TO DISCUSS DC PLANS.  HE STATES MOTHER IN LAW AND BROTHER IN LAW TO STAY WITH PT WHILE WIFE WORKS; WIFE WILL BE THERE AT NIGHT.  WILL FOLLOW FOR HOME NEEDS AS PT PROGRESSES.   Anticipated DC Date:  02/26/2012   Anticipated DC Plan:  HOME W HOME HEALTH SERVICES      DC Planning Services  CM consult      Choice offered to / List presented to:             Status of service:  In process, will continue to follow Medicare Important Message given?   (If response is "NO", the following Medicare IM given date fields will be blank) Date Medicare IM given:   Date Additional Medicare IM given:    Discharge Disposition:    Per UR Regulation:    If discussed at Long Length of Stay Meetings, dates discussed:    Comments:

## 2012-02-23 NOTE — Progress Notes (Signed)
Patient walked 200 feet with rolling walker independently.  Tolerated well.  Returned to bed, VSS.  Will continue to monitor.

## 2012-02-23 NOTE — Progress Notes (Addendum)
                    [address]            [place] [address]          [phone]     2 Days Post-Op Procedure(s) (LRB): MINIMALLY INVASIVE MITRAL VALVE REPAIR (MVR) (Right)  Subjective: Comfortable, walked 3 x yesterday.  Wants CT out so he can walk without assistance.  Objective: Vital signs in last 24 hours: Patient Vitals for the past 24 hrs:  BP Temp Temp src Pulse Resp SpO2 Weight  02/23/12 0440 122/61 mmHg 98.1 F (36.7 C) Oral 72  18  95 % 88.089 kg (194 lb 3.2 oz)  02/22/12 1947 101/58 mmHg 98.1 F (36.7 C) Oral 70  19  97 % -  02/22/12 1838 118/58 mmHg - - 70  19  - -  02/22/12 1600 104/48 mmHg - - 70  18  - -  02/22/12 1339 102/40 mmHg - Oral 70  18  - -  02/22/12 1219 88/55 mmHg 98.1 F (36.7 C) Oral 70  18  98 % -  02/22/12 1100 102/53 mmHg - - 70  21  94 % -  02/22/12 1000 98/56 mmHg - - 70  17  95 % -  02/22/12 0900 103/66 mmHg - - 70  25  99 % -  02/22/12 0832 - - - - - 100 % -  02/22/12 0800 107/67 mmHg 98.8 F (37.1 C) Core 80  19  100 % -   Current Weight  02/23/12 88.089 kg (194 lb 3.2 oz)   Pre-op wt= 89.3 kg  Intake/Output from previous day: 05/08 0701 - 05/09 0700 In: 142 [I.V.:40; IV Piggyback:102] Out: 2635 [Urine:2305; Chest Tube:330]    PHYSICAL EXAM:  Heart: RRR, 66-68 under pacer Lungs: diminished BS in R base Wound: clean and dry Extremities: no significant edema   Lab Results: CBC: Basename 02/23/12 0555 02/22/12 0353  WBC 7.1 5.0  HGB 11.2* 10.7*  HCT 32.6* 30.6*  PLT 77* 75*   BMET:  Basename 02/23/12 0555 02/22/12 0353  NA 135 132*  K 3.6 4.2  CL 102 104  CO2 25 22  GLUCOSE 100* 123*  BUN 14 15  CREATININE 0.90 0.89  CALCIUM 7.8* 7.3*    PT/INR:  Basename 02/23/12 0555  LABPROT 15.9*  INR 1.24     Assessment/Plan: S/P Procedure(s) (LRB): MINIMALLY INVASIVE MITRAL VALVE REPAIR (MVR) (Right) CV- SR in high 60s under pacer. Will leave pacer on AAI @50  until seen by MD. On Amio,  Bystolic, Benicar, BPs running 80s-120 systolic.  Will monitor.   Coumadin for MV ring. Mild vol overload- continue diuresis. Hypokalemia- replace K+ Thrombocytopenia- plts stable.   CT output still >300/24 hrs.  Will continue CTs for now. CRPI, pulm toilet.   LOS: 2 days    COLLINS,GINA H 02/23/2012   I have seen and examined the patient and agree with the assessment and plan as outlined.  Will disconnect pacemaker.  Leave wires and tubes until tomorrow.  Check portable ECHO due to murmur on exam.  ? SAM  Kiowa Hollar H 02/23/2012 9:04 AM

## 2012-02-23 NOTE — Progress Notes (Signed)
Patient ambulated 450 feet with rolling walker, assist x 1.  Tolerated well.  Returned to chair, VSS.  Will continue to monitor.

## 2012-02-23 NOTE — Progress Notes (Signed)
TCTS BRIEF SICU PROGRESS NOTE  2 Days Post-Op  S/P Procedure(s) (LRB): MINIMALLY INVASIVE MITRAL VALVE REPAIR (MVR) (Right)   Mr Vejar continues to do well ECHO looks very good with no MR, no SAM. Murmur likely due to turbulent flow across LVOT  Plan: Continue routine care.  Klohe Lovering H 02/23/2012 5:44 PM

## 2012-02-23 NOTE — Progress Notes (Signed)
Subjective:  Up in chair, no complaints  Objective:  Vital Signs in the last 24 hours: Temp:  [98.1 F (36.7 C)] 98.1 F (36.7 C) (05/09 0440) Pulse Rate:  [70-72] 72  (05/09 0440) Resp:  [18-19] 18  (05/09 0440) BP: (88-122)/(40-61) 122/61 mmHg (05/09 0440) SpO2:  [95 %-98 %] 95 % (05/09 0440) Weight:  [88.089 kg (194 lb 3.2 oz)] 88.089 kg (194 lb 3.2 oz) (05/09 0440)  Intake/Output from previous day:  Intake/Output Summary (Last 24 hours) at 02/23/12 1150 Last data filed at 02/23/12 0844  Gross per 24 hour  Intake    100 ml  Output   1680 ml  Net  -1580 ml    Physical Exam: General appearance: alert, cooperative and no distress Lungs: decreased breath sounds Rt base Heart: regular rate and rhythm and 2-3/6 systolic murmur LSB apex   Rate: 65  Rhythm: normal sinus rhythm  Lab Results:  Basename 02/23/12 0555 02/22/12 0353  WBC 7.1 5.0  HGB 11.2* 10.7*  PLT 77* 75*    Basename 02/23/12 0555 02/22/12 0353  NA 135 132*  K 3.6 4.2  CL 102 104  CO2 25 22  GLUCOSE 100* 123*  BUN 14 15  CREATININE 0.90 0.89   No results found for this basename: TROPONINI:2,CK,MB:2 in the last 72 hours Hepatic Function Panel No results found for this basename: PROT,ALBUMIN,AST,ALT,ALKPHOS,BILITOT,BILIDIR,IBILI in the last 72 hours No results found for this basename: CHOL in the last 72 hours  Basename 02/23/12 0555  INR 1.24    Imaging: Imaging results have been reviewed  Cardiac Studies:  Assessment/Plan:   Principal Problem:  *S/P mitral valve repair Active Problems:  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  I have reviewed the chart, notes and new data.  I agree with PA's note.  Key new  complaints: constipation Key examination changes: pericardial rub. Holosystolic murmur at LLSB 2/6 Key new findings / data: Hgb 11.2 and renal function normal  PLAN: Good progress postop. Will review echo  Thurmon Fair, MD, [facility] [phone] 02/23/2012, 2:36 PM

## 2012-02-23 NOTE — Progress Notes (Signed)
CARDIAC REHAB PHASE I   PRE:  Rate/Rhythm: 67 SR  BP:  Supine:   Sitting: 112/67  Standing:    SaO2: 96 RA  MODE:  Ambulation: 550 ft   POST:  Rate/Rhythem: 69 SR  BP:  Supine:   Sitting: 122/63  Standing:    SaO2: 97 RA 1105-1140 Assisted X 1 and used walker to ambulate. Gait steady VS stable. Pt only c/o is of slight nausea after walk. To recliner after walk with call light in reach.  Matthew Mclean

## 2012-02-24 ENCOUNTER — Inpatient Hospital Stay (HOSPITAL_COMMUNITY): Payer: BC Managed Care – PPO

## 2012-02-24 LAB — PROTIME-INR: INR: 1.11 (ref 0.00–1.49)

## 2012-02-24 MED ORDER — ASPIRIN 81 MG PO TBEC: 81.0000 mg | DELAYED_RELEASE_TABLET | Freq: Every day | ORAL | Status: DC

## 2012-02-24 MED ORDER — WARFARIN SODIUM 2.5 MG PO TABS: 2.5000 mg | ORAL_TABLET | Freq: Every day | ORAL | Status: DC

## 2012-02-24 MED ORDER — OXYCODONE HCL 5 MG PO TABS: 5.0000 mg | ORAL_TABLET | ORAL | Status: DC | PRN

## 2012-02-24 NOTE — Discharge Summary (Signed)
[address]            [name] [address]          [phone]         Discharge Summary  Name: Matthew Mclean DOB: May 29, 1956 56 y.o. MRN: [id number]  Admission Date: 02/21/2012 Discharge Date:    Admitting Diagnosis:  Severe mitral regurgitation  Mitral valve prolapse  Discharge Diagnosis:   Severe mitral regurgitation  Mitral valve prolapse  Hypertension  Hypothyroidism   Procedures: Minimally-Invasive Mitral Valve Repair on 02/21/2012 Complex valvuloplasty including triangular resection of posterior leaflet with sliding-folding plasty  Goretex neocord replacement x 4  Sorin Memo 3D Ring Annuloplasty (size 36mm, catalog #[id number], serial # [id number]    HPI:  The patient is a 56 y.o. male with a 2-3 year history of progressive exertional fatigue and exertional shortness of breath. The patient states that his symptoms have been vague in onset and severity, but he has definitely noticed a tendency towards worsening fatigue. He used to exercise quite regularly and he had to quit running do to severe fatigue and exertional shortness of breath. He was noted to have a heart murmur on physical exam by his primary care physician and an echocardiogram was performed demonstrating mitral valve prolapse with mitral regurgitation. The patient was referred to Dr. Allyson Sabal and subsequently underwent transesophageal echocardiogram confirming the presence of mitral valve prolapse with severe mitral regurgitation. The patient was referred to Dr. Cornelius Moras to consider elective surgical intervention. After evaluation of the patient and review of his films, Dr. Cornelius Moras felt that he would benefit from mitral valve repair via a minimally invasive approach. All risks, benefits and alternatives of surgery were explained in detail, and the patient agreed to proceed.     Hospital Course:  The patient was admitted to [facility] on 5/7/2013The patient was taken to the operating room  and underwent the above procedure.    The postoperative course was initially notable for bradycardia which required a AAI pacing. This has since resolved and the patient is maintaining normal sinus rhythm. He also developed a holosystolic cardiac murmur on postop day 2. A 2-D echocardiogram was performed which showed no evidence of mitral regurgitation. His murmur was felt likely to be due to turbulent flow across the left ventricular outflow tract. It has remained stable.  The patient has overall done well postoperatively. He has remained afebrile and vital signs have been stable. He has been restarted on his home blood pressure medications with good control. He was mildly volume overloaded and was diuresed back down to his preoperative weight. He is ambulating in halls without problem. He is tolerating a regular diet. All tubes and lines have been discontinued. He has been started on Coumadin for his mitral valve repair. We anticipate discharge home within the next 24 hours provided no acute changes occur.    Recent vital signs:  Filed Vitals:   02/24/12 0554  BP: 119/82  Pulse: 66  Temp: 98.7 F (37.1 C)  Resp: 18    Recent laboratory studies:  CBC: Basename 02/23/12 0555 02/22/12 0353  WBC 7.1 5.0  HGB 11.2* 10.7*  HCT 32.6* 30.6*  PLT 77* 75*   BMET:  Basename 02/23/12 0555 02/22/12 0353  NA 135 132*  K 3.6 4.2  CL 102 104  CO2 25 22  GLUCOSE 100* 123*  BUN 14 15  CREATININE 0.90  0.89  CALCIUM 7.8* 7.3*    PT/INR:  Basename 02/24/12 0620  LABPROT 14.5  INR 1.11    Discharge Medications:   Medication List  As of 02/24/2012 12:52 PM   STOP taking these medications         ibuprofen 200 MG tablet         TAKE these medications         amiodarone 200 MG tablet   Commonly known as: PACERONE   Take 1 tablet (200 mg total) by mouth 2 (two) times daily. Begin 7 days prior to surgery.      aspirin 81 MG EC tablet   Take 1 tablet (81 mg total) by mouth daily.       chlorpheniramine 4 MG tablet   Commonly known as: CHLOR-TRIMETON   Take 4 mg by mouth 2 (two) times daily as needed.      docusate sodium 100 MG capsule   Commonly known as: COLACE   Take 100 mg by mouth daily.      levothyroxine 125 MCG tablet   Commonly known as: SYNTHROID, LEVOTHROID   Take 125 mcg by mouth daily.      nebivolol 5 MG tablet   Commonly known as: BYSTOLIC   Take 5 mg by mouth daily.      oxyCODONE 5 MG immediate release tablet   Commonly known as: Oxy IR/ROXICODONE   Take 1-2 tablets (5-10 mg total) by mouth every 3 (three) hours as needed for pain.      rosuvastatin 10 MG tablet   Commonly known as: CRESTOR   Take 10 mg by mouth daily.      valsartan 320 MG tablet   Commonly known as: DIOVAN   Take 320 mg by mouth daily.      warfarin 2.5 MG tablet   Commonly known as: COUMADIN   Take 1 tablet (2.5 mg total) by mouth daily at 6 PM.            Discharge Instructions:  The patient is to refrain from driving, heavy lifting or strenuous activity.  May shower daily and clean incisions with soap and water.  May resume regular diet.  Discharge Orders    Future Appointments: Provider: Department: Dept Phone: Center:   03/05/2012 4:30 PM Purcell Nails, MD [facility] [phone] [facility]     Future Orders Please Complete By Expires   Amb Referral to Cardiac Rehabilitation      Comments:   Going to [facility] Phase 2      Follow-up Information    Follow up with Purcell Nails, MD on 03/05/2012. (Have a chest x-ray at 3:30, then see Dr. Cornelius Moras at 4:30)    Contact information:   [address] [place] [address] [phone]       Follow up with Runell Gess, MD. Schedule an appointment as soon as possible for a visit in 2 weeks.   Contact information:   [address] [place] [address] [phone]       Follow up with [facility] on 02/27/2012. (Have bloodwork for  Coumadin (PT/INR) )           Gustavo Meditz H 02/24/2012, 12:52 PM

## 2012-02-24 NOTE — Progress Notes (Signed)
D/C'd patients chest tubes per MD order and hospital policy.  Patient tolerated well.  Minimal drainage, dressing applied.  Will continue to monitor.  PCXR done.

## 2012-02-24 NOTE — Discharge Summary (Signed)
I agree with the above discharge summary and plan for follow-up.  Jadae Steinke H  

## 2012-02-24 NOTE — Progress Notes (Addendum)
                    [address]            [place] [address]          [phone]     3 Days Post-Op Procedure(s) (LRB): MINIMALLY INVASIVE MITRAL VALVE REPAIR (MVR) (Right)  Subjective: Feels well, no complaints.  Objective: Vital signs in last 24 hours: Patient Vitals for the past 24 hrs:  BP Temp Temp src Pulse Resp SpO2 Weight  02/24/12 0554 119/82 mmHg 98.7 F (37.1 C) Oral 66  18  97 % 86.955 kg (191 lb 11.2 oz)  02/23/12 2206 112/70 mmHg 99.5 F (37.5 C) Oral 71  18  97 % -  02/23/12 1444 101/53 mmHg - - 63  17  - -  02/23/12 1308 81/49 mmHg 98.2 F (36.8 C) Oral 66  18  98 % -   Current Weight  02/24/12 86.955 kg (191 lb 11.2 oz)  Pre-op wt= 89.3 kg   Intake/Output from previous day: 05/09 0701 - 05/10 0700 In: 100 [P.O.:100] Out: 840 [Urine:600; Chest Tube:240]    PHYSICAL EXAM:  Heart: RRR, + holosystolic murmur Lungs: slightly decreased BS in R base Wound: clean and dry Extremities: mild LE edema   Lab Results: CBC: Basename 02/23/12 0555 02/22/12 0353  WBC 7.1 5.0  HGB 11.2* 10.7*  HCT 32.6* 30.6*  PLT 77* 75*   BMET:  Basename 02/23/12 0555 02/22/12 0353  NA 135 132*  K 3.6 4.2  CL 102 104  CO2 25 22  GLUCOSE 100* 123*  BUN 14 15  CREATININE 0.90 0.89  CALCIUM 7.8* 7.3*    PT/INR:  Basename 02/24/12 0620  LABPROT 14.5  INR 1.11     Assessment/Plan: S/P Procedure(s) (LRB): MINIMALLY INVASIVE MITRAL VALVE REPAIR (MVR) (Right) CV- rate/rhythm stable. BPs ok.  Continue current meds.  CT output decreasing (around 240 ml/24 hrs).  Hopefully can d/c CT soon. Continue anticoagulation for MV ring- may need to increase Coumadin dose. CRPI, IS.     LOS: 3 days    COLLINS,GINA H 02/24/2012   I have seen and examined the patient and agree with the assessment and plan as outlined.  D/C pacing wires and chest tubes.  Tentatively for d/c home tomorrow.  Will not continue lasix at d/c  [facility]  H 02/24/2012 8:26 AM

## 2012-02-24 NOTE — Progress Notes (Signed)
Patient seen & examined. Chart reviewed.  He is stable from a cardiac standpoint with plan for d/c tomorrow.  We will be available tomorrow to assist with scheduling cardiology f/u.  Marykay Lex, M.D., M.S. [facility] [address] [place], [place]  [address]  [phone] Pager # [phone]  02/24/2012 5:30 PM

## 2012-02-24 NOTE — Progress Notes (Signed)
Patient ambulated 500 feet, independently.  Tolerated well.  Will continue to monitor.

## 2012-02-24 NOTE — Progress Notes (Signed)
D/C'd patients EPW per MD order and hospital policy.  All ends intact.  Patient tolerated well.  No bleeding.  Bedrest for 1 hour.  Will continue to monitor.

## 2012-02-24 NOTE — Progress Notes (Signed)
CARDIAC REHAB PHASE I   PRE:  Rate/Rhythm:   BP:  Supine:   Sitting:   Standing:    SaO2: 100 RA in hall  MODE:  Ambulation: 890 ft   POST:  Rate/Rhythem: 79 SR  BP:  Supine:   Sitting: 121/69  Standing:    SaO2: 99 RA 1120-1200 Pt up in hall walking without walker. Gait steady He denies any problems. Tolerated walk well with increased distance. After walk to recliner with call light in reach. Completed discharge education  with pt.  Encouraged him to watch OHS and coumadin videos.  Beatrix Fetters

## 2012-02-24 NOTE — Progress Notes (Signed)
Patient ambulated 450 ft with rolling walker independently.  Tolerated well.  VSS.  Will continue to monitor.

## 2012-02-25 ENCOUNTER — Inpatient Hospital Stay (HOSPITAL_COMMUNITY): Payer: BC Managed Care – PPO

## 2012-02-25 LAB — CBC
HCT: 34.4 % — ABNORMAL LOW (ref 39.0–52.0)
MCH: 29.8 pg (ref 26.0–34.0)
MCV: 86 fL (ref 78.0–100.0)
Platelets: 120 10*3/uL — ABNORMAL LOW (ref 150–400)
RBC: 4 MIL/uL — ABNORMAL LOW (ref 4.22–5.81)

## 2012-02-25 LAB — BASIC METABOLIC PANEL
BUN: 13 mg/dL (ref 6–23)
CO2: 22 mEq/L (ref 19–32)
Calcium: 8.9 mg/dL (ref 8.4–10.5)
Creatinine, Ser: 0.91 mg/dL (ref 0.50–1.35)
Glucose, Bld: 104 mg/dL — ABNORMAL HIGH (ref 70–99)

## 2012-02-25 NOTE — Plan of Care (Signed)
Problem: Discharge Progression Outcomes Goal: Other Discharge Outcomes/Goals Outcome: Completed/Met Date Met:  02/25/12 Watched video #113 going home after heart surgery

## 2012-02-25 NOTE — Progress Notes (Signed)
[address]            [place] [address]          [phone]     4 Days Post-Op  Procedure(s) (LRB): MINIMALLY INVASIVE MITRAL VALVE REPAIR (MVR) (Right) Subjective: Overall conts to feel well  Objective  Telemetry NSR   Temp:  [98.4 F (36.9 C)-99.4 F (37.4 C)] 99.4 F (37.4 C) (05/11 0632) Pulse Rate:  [69-73] 73  (05/11 0632) Resp:  [18] 18  (05/11 9147) BP: (105-120)/(58-72) 111/72 mmHg (05/11 0632) SpO2:  [96 %-98 %] 97 % (05/11 0632) Weight:  [188 lb 11.4 oz (85.6 kg)] 188 lb 11.4 oz (85.6 kg) (05/11 8295)  No intake or output data in the 24 hours ending 02/25/12 0747     General appearance: alert, cooperative and no distress Heart: regular rate and rhythm, S1, S2 normal and 2/6 systolic murmur Lungs: clear to auscultation bilaterally Abdomen: soft, nontender Extremities: no edema Wound: incis healing well  Lab Results:  Basename 02/23/12 0555  NA 135  K 3.6  CL 102  CO2 25  GLUCOSE 100*  BUN 14  CREATININE 0.90  CALCIUM 7.8*  MG --  PHOS --   No results found for this basename: AST:2,ALT:2,ALKPHOS:2,BILITOT:2,PROT:2,ALBUMIN:2 in the last 72 hours No results found for this basename: LIPASE:2,AMYLASE:2 in the last 72 hours  Basename 02/25/12 0705 02/23/12 0555  WBC 6.7 7.1  NEUTROABS -- --  HGB 11.9* 11.2*  HCT 34.4* 32.6*  MCV 86.0 86.2  PLT 120* 77*   No results found for this basename: CKTOTAL:4,CKMB:4,TROPONINI:4 in the last 72 hours No components found with this basename: POCBNP:3 No results found for this basename: DDIMER in the last 72 hours No results found for this basename: HGBA1C in the last 72 hours No results found for this basename: CHOL,HDL,LDLCALC,TRIG,CHOLHDL in the last 72 hours No results found for this basename: TSH,T4TOTAL,FREET3,T3FREE,THYROIDAB in the last 72 hours No results found for this basename: VITAMINB12,FOLATE,FERRITIN,TIBC,IRON,RETICCTPCT in the last 72  hours  Medications: Scheduled    . acetaminophen  1,000 mg Oral Q6H  . amiodarone  200 mg Oral BID  . aspirin EC  81 mg Oral Daily  . atorvastatin  20 mg Oral q1800  . bisacodyl  10 mg Oral Daily   Or  . bisacodyl  10 mg Rectal Daily  . docusate sodium  200 mg Oral Daily  . furosemide  40 mg Oral BID  . levothyroxine  125 mcg Oral Daily  . nebivolol  5 mg Oral Daily  . olmesartan  10 mg Oral Daily  . pantoprazole  40 mg Oral Q1200  . potassium chloride  40 mEq Oral BID  . sodium chloride  3 mL Intravenous Q12H  . warfarin  2.5 mg Oral q1800  . warfarin   Does not apply Once  . Warfarin - Physician Dosing Inpatient   Does not apply q1800     Radiology/Studies:  Dg Chest Port 1 View  02/24/2012  *RADIOLOGY REPORT*  Clinical Data: Chest tube removal.  PORTABLE CHEST - 1 VIEW  Comparison: 02/23/2012.  Findings: Right-sided chest tube has been removed.  Tiny right apical pneumothorax without change.  Bibasilar atelectatic changes.  Cardiomegaly.  Mildly tortuous aorta.  IMPRESSION:  Right-sided chest tube has been removed.  Tiny right apical pneumothorax without change.  Original Report Authenticated By: Fuller Canada, M.D.  INR:1.05 Will add last result for INR, ABG once components are confirmed Will add last 4 CBG results once components are confirmed  Assessment/Plan: S/P Procedure(s) (LRB): MINIMALLY INVASIVE MITRAL VALVE REPAIR (MVR) (Right)  1. Doing well, stable for discharge today   LOS: 4 days    Keziyah Kneale E 5/11/20137:47 AM

## 2012-02-29 ENCOUNTER — Other Ambulatory Visit: Payer: Self-pay | Admitting: Thoracic Surgery (Cardiothoracic Vascular Surgery)

## 2012-02-29 DIAGNOSIS — I059 Rheumatic mitral valve disease, unspecified: Secondary | ICD-10-CM

## 2012-03-05 ENCOUNTER — Ambulatory Visit (INDEPENDENT_AMBULATORY_CARE_PROVIDER_SITE_OTHER): Payer: Self-pay | Admitting: Thoracic Surgery (Cardiothoracic Vascular Surgery)

## 2012-03-05 ENCOUNTER — Ambulatory Visit
Admission: RE | Admit: 2012-03-05 | Discharge: 2012-03-05 | Disposition: A | Discharge status: Home / Self Care | Payer: BC Managed Care – PPO | Source: Ambulatory Visit | Attending: Thoracic Surgery (Cardiothoracic Vascular Surgery) | Admitting: Thoracic Surgery (Cardiothoracic Vascular Surgery)

## 2012-03-05 ENCOUNTER — Encounter: Payer: Self-pay | Admitting: Thoracic Surgery (Cardiothoracic Vascular Surgery)

## 2012-03-05 VITALS — BP 99/69 | HR 130 | Resp 20 | Ht 71.0 in | Wt 174.0 lb

## 2012-03-05 DIAGNOSIS — I340 Nonrheumatic mitral (valve) insufficiency: Secondary | ICD-10-CM

## 2012-03-05 DIAGNOSIS — Z9889 Other specified postprocedural states: Secondary | ICD-10-CM

## 2012-03-05 DIAGNOSIS — I059 Rheumatic mitral valve disease, unspecified: Secondary | ICD-10-CM

## 2012-03-05 NOTE — Progress Notes (Signed)
[address]            [name] [address]          [phone]     CARDIOTHORACIC SURGERY OFFICE NOTE  Referring Provider is Runell Gess, MD PCP is [facility], MD, MD   HPI:  Patient returns for routine followup status post minimally invasive mitral valve repair on 02/21/2012. His postoperative recovery in the hospital was uncomplicated and he was discharged on the fourth postoperative day. He was noted to have a murmur on physical exam and he underwent a followup echocardiogram prior to discharge demonstrating intact mitral valve repair with no mitral regurgitation. There was turbulent flow across the left ventricular output tract with noted asymmetric septal hypertrophy.  There was not much if any systolic anterior motion (SAM) of the mitral valve. Since hospital discharge the patient has done exceptionally well. He is no longer taking any pain relievers he has only mild soreness in his chest wall. He has no shortness of breath and in fact he states that he feels better than he hasn't quite some time. He is eager to increase his physical activity and he wants to go back to work at least, limited basis.   Current Outpatient Prescriptions  Medication Sig Dispense Refill  . acetaminophen (TYLENOL) 500 MG tablet Take 500 mg by mouth every 6 (six) hours as needed.      Marland Kitchen aspirin EC 81 MG EC tablet Take 1 tablet (81 mg total) by mouth daily.      . chlorpheniramine (CHLOR-TRIMETON) 4 MG tablet Take 4 mg by mouth 2 (two) times daily as needed.      . docusate sodium (COLACE) 100 MG capsule Take 100 mg by mouth daily.      Marland Kitchen levothyroxine (SYNTHROID, LEVOTHROID) 125 MCG tablet Take 125 mcg by mouth daily.      . nebivolol (BYSTOLIC) 5 MG tablet Take 5 mg by mouth daily.      . rosuvastatin (CRESTOR) 10 MG tablet Take 10 mg by mouth daily.      . valsartan (DIOVAN) 320 MG tablet Take 320 mg by mouth daily.      Marland Kitchen warfarin (COUMADIN) 2.5 MG tablet Take  1 tablet (2.5 mg total) by mouth daily at 6 PM.  30 tablet  1      Physical Exam:   BP 99/69  Pulse 130  Resp 20  Ht 5\' 11"  (1.803 m)  Wt 174 lb (78.926 kg)  BMI 24.27 kg/m2  SpO2 98%  General:  Well-appearing  Chest:   Clear to auscultation with symmetrical breath sounds  CV:   Tachycardic but regular rhythm with no murmur  Incisions:  Clean and dry and healing very nicely  Abdomen:  Soft and nontender  Extremities:  Warm and well-perfused with no lower extremity edema  Diagnostic Tests:  2 channel telemetry rhythm strip demonstrates what appears to be sinus tachycardia. Atrial flutter cannot be ruled out. The rhythm is regular in narrow complex.   *RADIOLOGY REPORT*  Clinical Data: Cough. Mitral valve disorder. Hypertension.  Shortness of breath.  CHEST - 2 VIEW 03/05/2012 Comparison: 02/25/2012  Findings: Left basilar atelectasis has cleared and right basilar  atelectasis is improved compared the prior exam. The small pleural  effusions have resolved.  Mitral valve prosthesis noted. Mildly low lung volumes are  present, along with mild cardiomegaly. No edema noted.  IMPRESSION:  1. Resolved left basilar atelectasis and resolved pleural  effusions. Minimal residual right basilar atelectasis or scarring.  2. Mild cardiomegaly, without edema.  Original Report Authenticated By: Dellia Cloud, M.D.   Impression:  The patient is doing very well just 13 days status post MI invasive mitral valve repair. He looks well and feels well. His pulses elevated and rhythm strip suggests sinus tachycardia, although atrial flutter cannot be ruled out. Apparently he did not get a prescription to continue amiodarone at the time of hospital discharge and he has not been taking it since he went home. His prothrombin time checked on one occasion. He has not heard whether or not the results of this were okay and he has not been instructed to change his dose of Coumadin.    Plan:  I  discussed the patient's tachycardia with Dr. Allyson Sabal over the telephone. The patient will go directly to Dr. Hazle Coca office for a 12-lead left echocardiogram.  I would favor increasing his beta blocker dose substantially and/or restarting amiodarone depending upon results of his electrocardiogram. If the patient is not having atrial arrhythmias then I think it would be reasonable for him to start driving an automobile short distances during the daytime and to gradually continue to increase his physical activity as tolerated. However, I have cautioned him that we need to find out for sure what his rhythm is. He will need to have his prothrombin time checked and Coumadin dose adjusted as needed. We will plan to see him back in 4 weeks for further followup.   [name]. Cornelius Moras, MD 03/05/2012 4:01 PM

## 2012-03-05 NOTE — Patient Instructions (Signed)
Go directly to Dr. Hazle Coca office for 12-lead electrocardiogram. If the electrocardiogram does not reveal irregular heart rhythm, then I think it is safe for the patient has been instructed that they may return driving an automobile as long as they are no longer requiring oral narcotic pain relievers during the daytime.  They have been advised to start driving short distances during the daylight and gradually increase from there as they feel comfortable.  The patient has been advised to continue to gradually increase their physical activity as tolerated.  They should refrain from any heavy lifting or strenuous use of their arms and shoulders until at least 8 weeks from the time of their surgery, and they should avoid activities that cause increased pain in their chest on the side of their surgical incision.

## 2012-04-02 ENCOUNTER — Ambulatory Visit (INDEPENDENT_AMBULATORY_CARE_PROVIDER_SITE_OTHER): Payer: Self-pay | Admitting: Thoracic Surgery (Cardiothoracic Vascular Surgery)

## 2012-04-02 ENCOUNTER — Encounter: Payer: Self-pay | Admitting: Thoracic Surgery (Cardiothoracic Vascular Surgery)

## 2012-04-02 VITALS — BP 125/80 | HR 110 | Resp 18 | Ht 71.5 in | Wt 195.0 lb

## 2012-04-02 DIAGNOSIS — I340 Nonrheumatic mitral (valve) insufficiency: Secondary | ICD-10-CM

## 2012-04-02 DIAGNOSIS — Z9889 Other specified postprocedural states: Secondary | ICD-10-CM | POA: Insufficient documentation

## 2012-04-02 DIAGNOSIS — I059 Rheumatic mitral valve disease, unspecified: Secondary | ICD-10-CM

## 2012-04-02 DIAGNOSIS — I341 Nonrheumatic mitral (valve) prolapse: Secondary | ICD-10-CM

## 2012-04-02 DIAGNOSIS — I4892 Unspecified atrial flutter: Secondary | ICD-10-CM

## 2012-04-02 HISTORY — DX: Unspecified atrial flutter: I48.92

## 2012-04-02 NOTE — Patient Instructions (Signed)
The patient has been advised to continue to gradually increase their physical activity as tolerated.  They should refrain from any heavy lifting or strenuous use of their arms and shoulders until at least 8 weeks from the time of their surgery, and they should avoid activities that cause increased pain in their chest on the side of their surgical incision.  

## 2012-04-02 NOTE — Progress Notes (Signed)
[address]            [name] [address]          [phone]     CARDIOTHORACIC SURGERY OFFICE NOTE  Referring Provider is Runell Gess, MD PCP is [facility], MD   HPI:  Patient returns for followup status post minimally invasive mitral valve repair on 02/21/2012. He was last seen here in the office on 03/05/2012. At that time he presented in atrial flutter. Since then he has been followed carefully by Dr. Allyson Sabal. He has remained in atrial flutter with a controlled rate. He is taking amiodarone and is now therapeutic on Coumadin.  Dr. Allyson Sabal tentatively plans elective DC cardioversion in one month if he does not spontaneously convert back to sinus rhythm on his own.  Clinically the patient is doing very well. He is back enjoying reasonably normal activity and he has even begun playing tennis with his daughter. He is eager to get back to jogging and running, but he has been instructed to hold off on any strenuous exercise until his rhythm issues have been corrected. He has minimal residual soreness in his right chest. He otherwise feels quite well. He has no shortness of breath. He does still get tired more easily than his baseline, but this is expected under the circumstances.   Current Outpatient Prescriptions  Medication Sig Dispense Refill  . acetaminophen (TYLENOL) 500 MG tablet Take 500 mg by mouth every 6 (six) hours as needed.      Marland Kitchen amiodarone (PACERONE) 200 MG tablet       . chlorpheniramine (CHLOR-TRIMETON) 4 MG tablet Take 4 mg by mouth every morning.       . docusate sodium (COLACE) 100 MG capsule Take 100 mg by mouth daily.      Marland Kitchen levothyroxine (SYNTHROID, LEVOTHROID) 125 MCG tablet Take 125 mcg by mouth daily.      . nebivolol (BYSTOLIC) 5 MG tablet Take 15 mg by mouth daily.       . rosuvastatin (CRESTOR) 10 MG tablet Take 5 mg by mouth daily.       . valsartan (DIOVAN) 320 MG tablet Take 320 mg by mouth daily.      Marland Kitchen warfarin  (COUMADIN) 2.5 MG tablet Take 1 tablet (2.5 mg total) by mouth daily at 6 PM.  30 tablet  1      Physical Exam:   BP 125/80  Pulse 110  Resp 18  Ht 5' 11.5" (1.816 m)  Wt 195 lb (88.451 kg)  BMI 26.82 kg/m2  SpO2 97%  General:  Well appearing  Chest:   clear  CV:   Irregular rhythm w/out murmur  Incisions:  Healing nicely  Abdomen:  soft  Extremities:  warm  Diagnostic Tests:  n/a   Impression:  Patient is doing well just over one month following minimally invasive mitral valve repair, although he does appear to remain in atrial flutter with a reasonably well-controlled rate.   Plan:  The patient will continue to follow closely with Dr. Allyson Sabal for further management of his postoperative atrial flutter. I've encouraged him to gradually increase his physical activity as tolerated, but until his rhythm has been corrected he should avoid strenuous exertion. All of his questions been addressed. Wound plan to see him back in 2 months for further followup.    [name]. Cornelius Moras, MD 04/02/2012 4:39 PM

## 2012-04-17 ENCOUNTER — Other Ambulatory Visit: Payer: Self-pay | Admitting: Cardiovascular Disease

## 2012-04-18 ENCOUNTER — Encounter (HOSPITAL_COMMUNITY): Payer: Self-pay | Admitting: Pharmacy Technician

## 2012-04-24 ENCOUNTER — Encounter (HOSPITAL_COMMUNITY): Payer: Self-pay | Admitting: *Deleted

## 2012-04-24 ENCOUNTER — Encounter (HOSPITAL_COMMUNITY): Payer: Self-pay

## 2012-04-24 ENCOUNTER — Ambulatory Visit (HOSPITAL_COMMUNITY)
Admission: RE | Admit: 2012-04-24 | Discharge: 2012-04-24 | Disposition: A | Discharge status: Home / Self Care | Payer: BC Managed Care – PPO | Source: Ambulatory Visit | Attending: Cardiovascular Disease | Admitting: Cardiovascular Disease

## 2012-04-24 ENCOUNTER — Encounter (HOSPITAL_COMMUNITY)
Admission: RE | Disposition: A | Discharge status: Home / Self Care | Payer: Self-pay | Source: Ambulatory Visit | Attending: Cardiovascular Disease

## 2012-04-24 ENCOUNTER — Ambulatory Visit (HOSPITAL_COMMUNITY): Payer: BC Managed Care – PPO

## 2012-04-24 DIAGNOSIS — I4892 Unspecified atrial flutter: Secondary | ICD-10-CM | POA: Insufficient documentation

## 2012-04-24 DIAGNOSIS — I10 Essential (primary) hypertension: Secondary | ICD-10-CM | POA: Insufficient documentation

## 2012-04-24 HISTORY — PX: CARDIOVERSION: SHX1299

## 2012-04-24 LAB — PROTIME-INR: INR: 2.34 — ABNORMAL HIGH (ref 0.00–1.49)

## 2012-04-24 SURGERY — CARDIOVERSION
Anesthesia: General | Wound class: Clean

## 2012-04-24 MED ORDER — LIDOCAINE HCL (CARDIAC) 20 MG/ML IV SOLN
INTRAVENOUS | Status: DC | PRN
  Administered 2012-04-24: 30 mg via INTRAVENOUS

## 2012-04-24 MED ORDER — SODIUM CHLORIDE 0.9 % IV SOLN
INTRAVENOUS | Status: DC | PRN
  Administered 2012-04-24: 13:00:00 via INTRAVENOUS

## 2012-04-24 MED ORDER — PROPOFOL 10 MG/ML IV BOLUS
INTRAVENOUS | Status: DC | PRN
  Administered 2012-04-24 (×2): 100 mg via INTRAVENOUS

## 2012-04-24 MED ORDER — SODIUM CHLORIDE 0.9 % IV SOLN: INTRAVENOUS | Status: DC

## 2012-04-24 NOTE — Anesthesia Postprocedure Evaluation (Signed)
  Anesthesia Post-op Note  Patient: Matthew Mclean  Procedure(s) Performed: Procedure(s) (LRB): CARDIOVERSION (N/A)  Patient Location: PACU and Short Stay  Anesthesia Type: General  Level of Consciousness: awake, alert  and oriented  Airway and Oxygen Therapy: Patient Spontanous Breathing  Post-op Pain: none  Post-op Assessment: Post-op Vital signs reviewed  Post-op Vital Signs: Reviewed  Complications: No apparent anesthesia complications

## 2012-04-24 NOTE — Op Note (Signed)
Procedure: Electrical Cardioversion Indications:  Atrial Flutter  Procedure Details:  Consent: Risks of procedure as well as the alternatives and risks of each were explained to the (patient/caregiver).  Consent for procedure obtained.  Time Out: Verified patient identification, verified procedure, site/side was marked, verified correct patient position, special equipment/implants available, medications/allergies/relevent history reviewed, required imaging and test results available.  Performed  Patient placed on cardiac monitor, pulse oximetry, supplemental oxygen as necessary.  Sedation given: Propofol 200 mg Pacer pads placed anterior and posterior chest.  Cardioverted 4 time(s).  Cardioverted at successful.  Evaluation: Findings: Post procedure EKG shows: NSR Complications: None Patient did tolerate procedure well.  Time Spent Directly with the Patient:    Nealie Mchatton J 04/24/2012, 2:23 PM

## 2012-04-24 NOTE — Anesthesia Preprocedure Evaluation (Signed)
Anesthesia Evaluation  Patient identified by MRN, date of birth, ID band Patient awake    Reviewed: Allergy & Precautions, H&P , NPO status , Patient's Chart, lab work & pertinent test results  Airway Mallampati: I TM Distance: >3 FB Neck ROM: Full    Dental  (+) Teeth Intact and Dental Advisory Given   Pulmonary  breath sounds clear to auscultation        Cardiovascular hypertension, Pt. on medications Rhythm:Irregular Rate:Normal     Neuro/Psych    GI/Hepatic   Endo/Other    Renal/GU      Musculoskeletal   Abdominal   Peds  Hematology   Anesthesia Other Findings   Reproductive/Obstetrics                           Anesthesia Physical Anesthesia Plan  ASA: III  Anesthesia Plan: General   Post-op Pain Management:    Induction: Intravenous  Airway Management Planned: Mask  Additional Equipment:   Intra-op Plan:   Post-operative Plan:   Informed Consent: I have reviewed the patients History and Physical, chart, labs and discussed the procedure including the risks, benefits and alternatives for the proposed anesthesia with the patient or authorized representative who has indicated his/her understanding and acceptance.   Dental advisory given  Plan Discussed with: CRNA, Anesthesiologist and Surgeon  Anesthesia Plan Comments:         Anesthesia Quick Evaluation

## 2012-04-24 NOTE — H&P (Signed)
  H & P will be scanned in.  Pt was reexamined and existing H & P reviewed. No changes found.  Runell Gess, MD [facility] 04/24/2012 2:03 PM

## 2012-04-24 NOTE — Anesthesia Postprocedure Evaluation (Signed)
  Anesthesia Post-op Note  Patient: Matthew Mclean  Procedure(s) Performed: Procedure(s) (LRB): CARDIOVERSION (N/A)  Patient Location: Short Stay  Anesthesia Type: General  Level of Consciousness: awake, oriented and patient cooperative  Airway and Oxygen Therapy: Patient Spontanous Breathing and Patient connected to nasal cannula oxygen  Post-op Pain: none  Post-op Assessment: Post-op Vital signs reviewed and Patient's Cardiovascular Status Stable  Post-op Vital Signs: Reviewed and stable  Complications: No apparent anesthesia complications

## 2012-04-24 NOTE — Transfer of Care (Signed)
Immediate Anesthesia Transfer of Care Note  Patient: Matthew Mclean  Procedure(s) Performed: Procedure(s) (LRB): CARDIOVERSION (N/A)  Patient Location: Short Stay  Anesthesia Type: General  Level of Consciousness: awake, oriented and patient cooperative  Airway & Oxygen Therapy: Patient Spontanous Breathing and Patient connected to nasal cannula oxygen  Post-op Assessment: Report given to PACU RN and Post -op Vital signs reviewed and stable  Post vital signs: Reviewed and stable  Complications: No apparent anesthesia complications

## 2012-04-24 NOTE — Preoperative (Signed)
Beta Blockers   Reason not to administer Beta Blockers:Not Applicable, took Bystolic at 0700

## 2012-04-25 ENCOUNTER — Encounter (HOSPITAL_COMMUNITY): Payer: Self-pay | Admitting: Cardiovascular Disease

## 2012-06-04 ENCOUNTER — Ambulatory Visit: Payer: BC Managed Care – PPO | Admitting: Thoracic Surgery (Cardiothoracic Vascular Surgery)

## 2012-06-04 ENCOUNTER — Ambulatory Visit (INDEPENDENT_AMBULATORY_CARE_PROVIDER_SITE_OTHER): Payer: BC Managed Care – PPO | Admitting: Thoracic Surgery (Cardiothoracic Vascular Surgery)

## 2012-06-04 ENCOUNTER — Encounter: Payer: Self-pay | Admitting: Thoracic Surgery (Cardiothoracic Vascular Surgery)

## 2012-06-04 VITALS — BP 112/74 | HR 63 | Resp 18 | Ht 71.5 in | Wt 195.0 lb

## 2012-06-04 DIAGNOSIS — Z9889 Other specified postprocedural states: Secondary | ICD-10-CM

## 2012-06-04 DIAGNOSIS — I4892 Unspecified atrial flutter: Secondary | ICD-10-CM

## 2012-06-04 DIAGNOSIS — I059 Rheumatic mitral valve disease, unspecified: Secondary | ICD-10-CM

## 2012-06-04 NOTE — Progress Notes (Signed)
[address]            [name] [address]          [id number]     CARDIOTHORACIC SURGERY OFFICE NOTE  Referring Provider is Andree Coss, MD PCP is [facility], MD   HPI:  Patient returns for followup status post mitral valve repair on 02/21/2012. He subsequently developed persistent atrial flutter for which she ultimately underwent DC cardioversion on 04/24/2012 by Dr. Allyson Sabal. He returns to the office for further followup today. The patient reports that he is doing well although he complains that his exercise tolerance is still not quite back to what his baseline was. He questions whether or not he may have low testosterone.  He denies any shortness of breath. He has not had any tachypalpitations. Any no longer has any significant residual soreness in his chest. Otherwise feels well.   Current Outpatient Prescriptions  Medication Sig Dispense Refill  . acetaminophen (TYLENOL) 500 MG tablet Take 1,000 mg by mouth 2 (two) times daily.       Marland Kitchen amiodarone (PACERONE) 200 MG tablet Take 200 mg by mouth daily.       . chlorpheniramine (CHLOR-TRIMETON) 4 MG tablet Take 4 mg by mouth every morning.       . docusate sodium (COLACE) 100 MG capsule Take 100 mg by mouth daily.      Marland Kitchen levothyroxine (SYNTHROID, LEVOTHROID) 125 MCG tablet Take 125 mcg by mouth daily.      . nebivolol (BYSTOLIC) 10 MG tablet Take 10 mg by mouth daily.      . valsartan (DIOVAN) 320 MG tablet Take 160 mg by mouth daily.       Marland Kitchen warfarin (COUMADIN) 5 MG tablet Take 5 mg by mouth daily.          Physical Exam:   BP 112/74  Pulse 63  Resp 18  Ht 5' 11.5" (1.816 m)  Wt 195 lb (88.451 kg)  BMI 26.82 kg/m2  SpO2 98%  General:  Well-appearing  Chest:   Clear to auscultation with symmetrical breath sounds  CV:   Regular rate and rhythm without murmur  Incisions:  Healed nicely  Abdomen:  Soft and nontender  Extremities:  Warm and well-perfused  Diagnostic Tests:  2  channel telemetry rhythm strip demonstrates normal sinus rhythm   Impression:  Patient is doing well 3 months following mitral valve repair and he is maintaining sinus rhythm one month following DC cardioversion for atrial flutter   Plan:  I reassured the patient that his complaints of decreased exercise tolerance may certainly be related to his recent mitral valve repair and in particular to his continued medications with both amiodarone and nebivolol. We have not made any changes in his current medications that suggested that he remain patient and continue to gradually increase his physical activity as tolerated. This point he has no particular physical limitations, and with time hopefully his exercise tolerance to improve. I would favor stopping amiodarone at some point within the next 2 or 3 months. If he remains in sinus rhythm he could also be taken off of Coumadin at that time.  We will leave these decisions to the discretion of Dr. Allyson Sabal and colleagues. We will have the patient return in 6 months for further followup and rhythm check. At some point it would be reasonable to check a followup echocardiogram to reassess his  mitral valve repair and left ventricular systolic function.   [name]. Cornelius Moras, MD 06/04/2012 11:21 AM

## 2012-06-04 NOTE — Patient Instructions (Signed)
The patient has been advised to continue to gradually increase their physical activity as tolerated.  He has no restrictions related to his previous surgery

## 2012-09-18 ENCOUNTER — Other Ambulatory Visit (HOSPITAL_COMMUNITY): Payer: Self-pay | Admitting: Cardiovascular Disease

## 2012-09-18 DIAGNOSIS — I4892 Unspecified atrial flutter: Secondary | ICD-10-CM

## 2012-09-25 ENCOUNTER — Ambulatory Visit (HOSPITAL_COMMUNITY)
Admission: RE | Admit: 2012-09-25 | Discharge: 2012-09-25 | Disposition: A | Discharge status: Home / Self Care | Payer: BC Managed Care – PPO | Source: Ambulatory Visit | Attending: Cardiovascular Disease | Admitting: Cardiovascular Disease

## 2012-09-25 DIAGNOSIS — R0609 Other forms of dyspnea: Secondary | ICD-10-CM | POA: Insufficient documentation

## 2012-09-25 DIAGNOSIS — I517 Cardiomegaly: Secondary | ICD-10-CM | POA: Insufficient documentation

## 2012-09-25 DIAGNOSIS — I4892 Unspecified atrial flutter: Secondary | ICD-10-CM

## 2012-09-25 DIAGNOSIS — R0989 Other specified symptoms and signs involving the circulatory and respiratory systems: Secondary | ICD-10-CM | POA: Insufficient documentation

## 2012-09-25 DIAGNOSIS — I10 Essential (primary) hypertension: Secondary | ICD-10-CM | POA: Insufficient documentation

## 2012-09-25 DIAGNOSIS — Z9889 Other specified postprocedural states: Secondary | ICD-10-CM

## 2012-09-25 NOTE — Progress Notes (Signed)
2D Echo Performed 09/25/2012    Joanthony Hamza, RCS  

## 2012-11-12 IMAGING — CR DG CHEST 1V PORT
1 series · 1 of 1 positions shown · non-contrast
Comparison: Chest 02/22/2012.

CLINICAL DATA: Status post CABG.  Chest tube.

PORTABLE CHEST - 1 VIEW

[view not recorded]
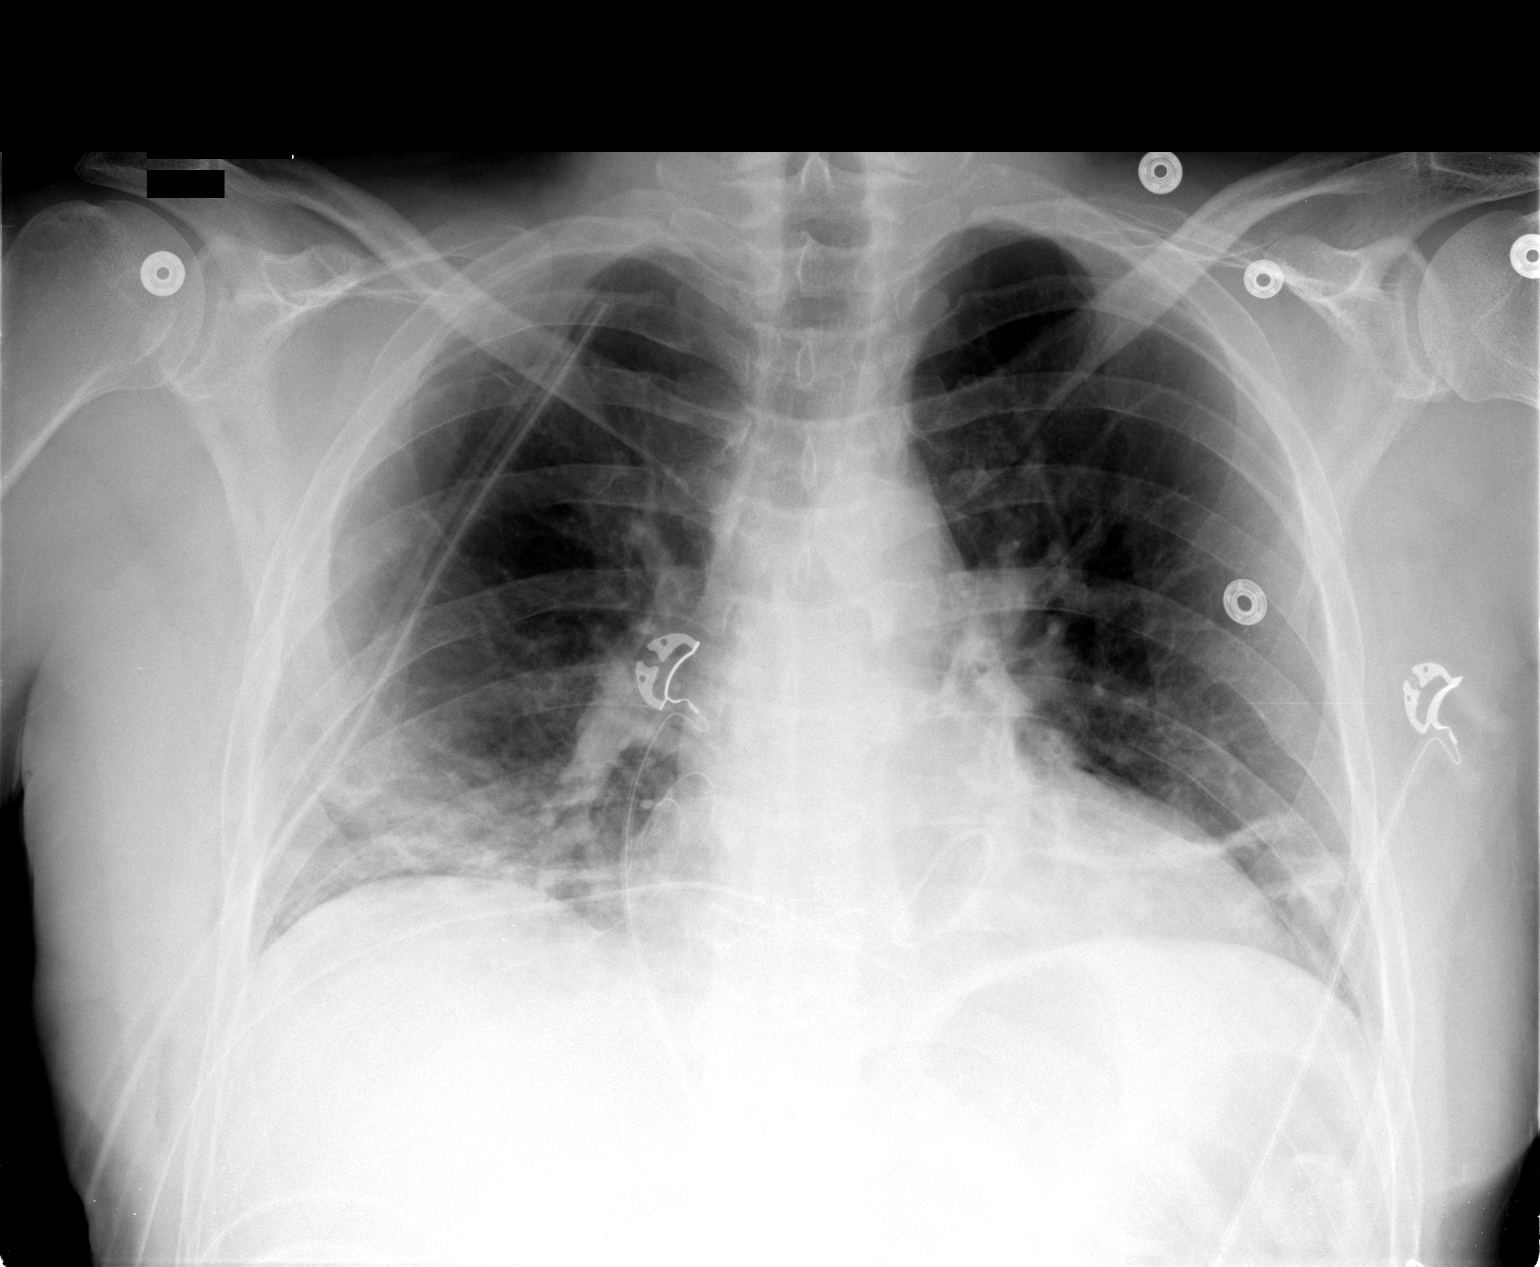

[1 of 1 positions shown; findings below may reference images not displayed]

FINDINGS: Right chest tube remains in place.  There is a tiny right
pneumothorax, less than 5%.  Bibasilar atelectasis is noted.  No
pulmonary edema.  Heart size is upper normal.
IMPRESSION: Tiny right pneumothorax with a chest tube in place.  Bibasilar
atelectasis.

## 2012-11-14 IMAGING — CR DG CHEST 2V
2 series · 2 of 2 positions shown · non-contrast
Comparison: Chest radiograph [DATE]

CLINICAL DATA: Valve replacement

CHEST - 2 VIEW

[w chest pa]
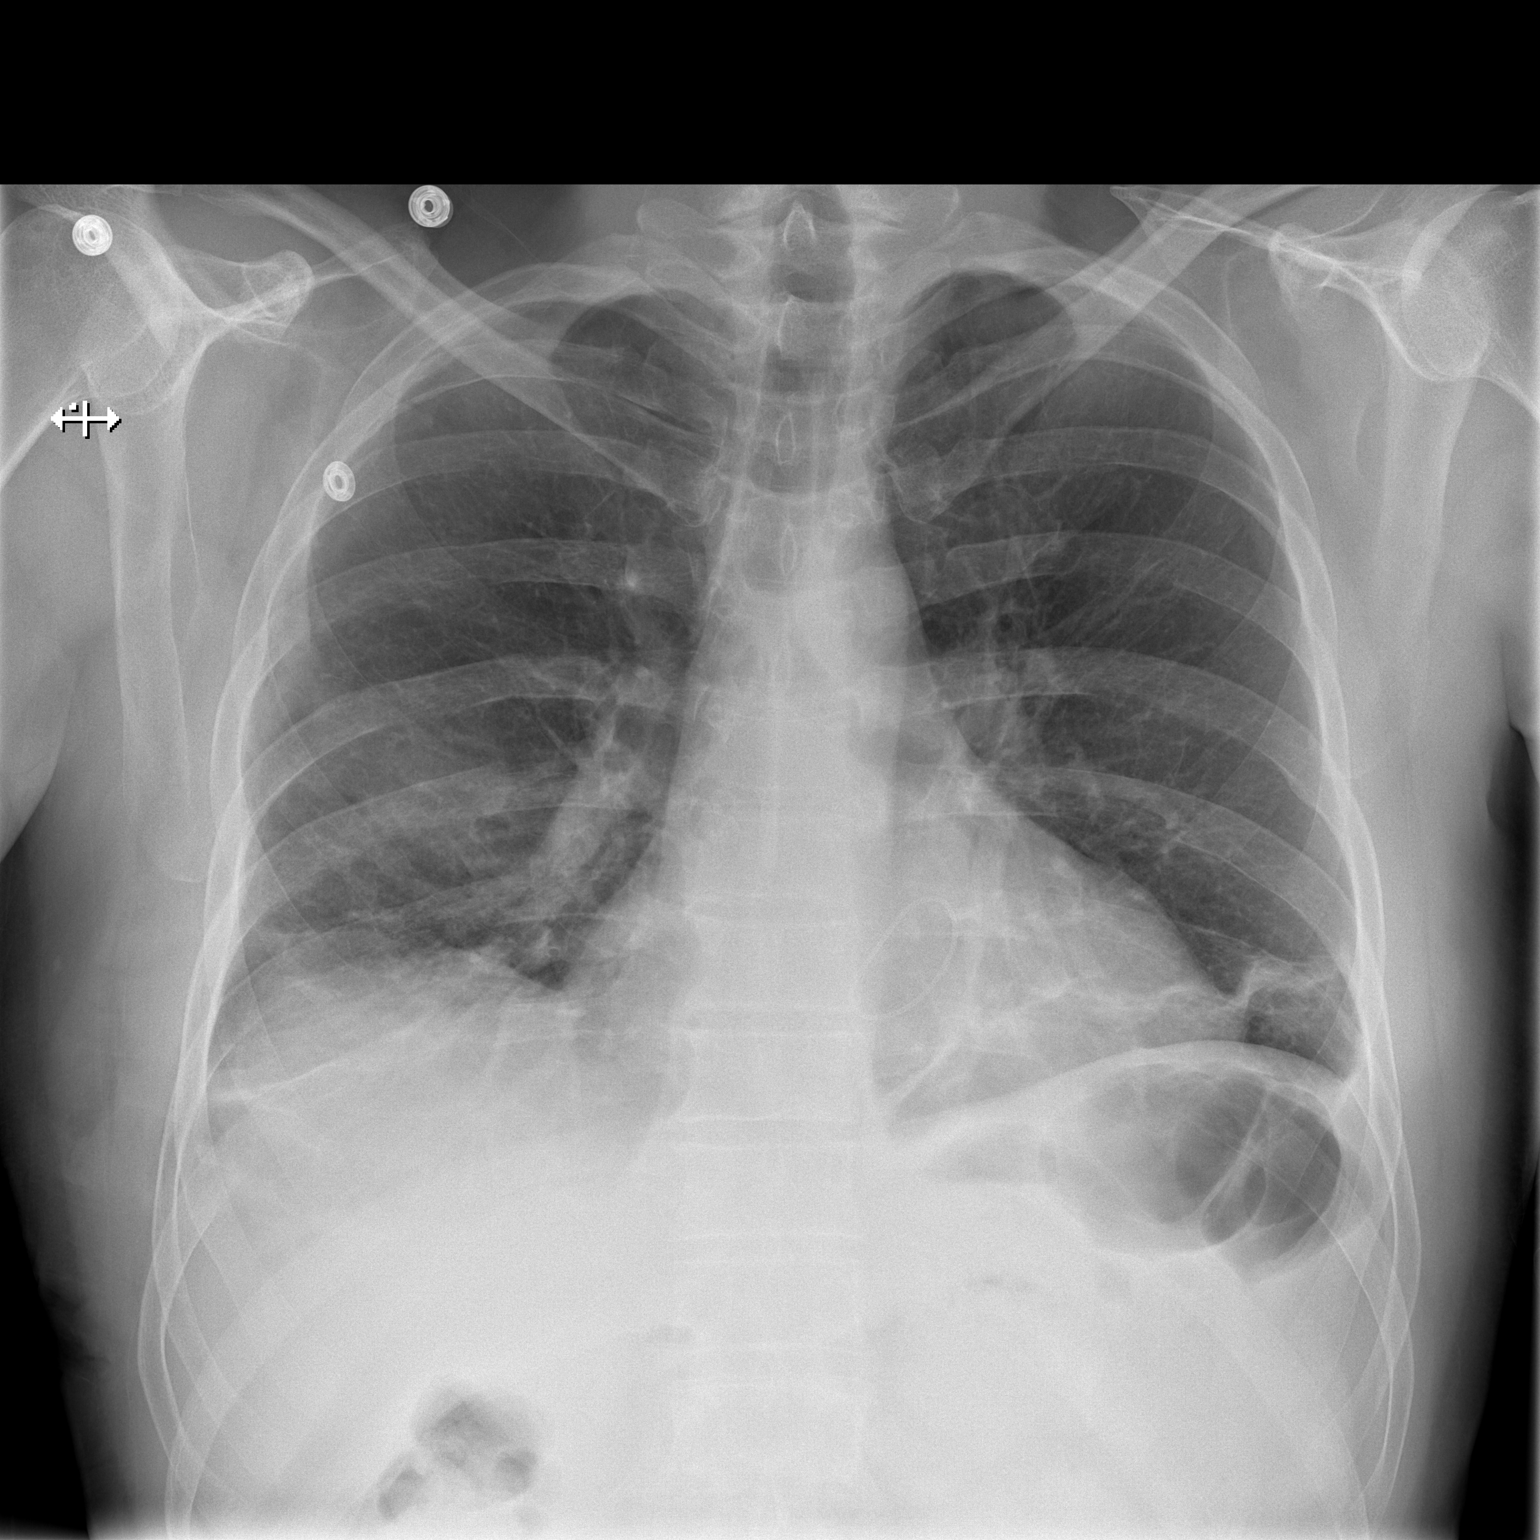

[w chest lat]
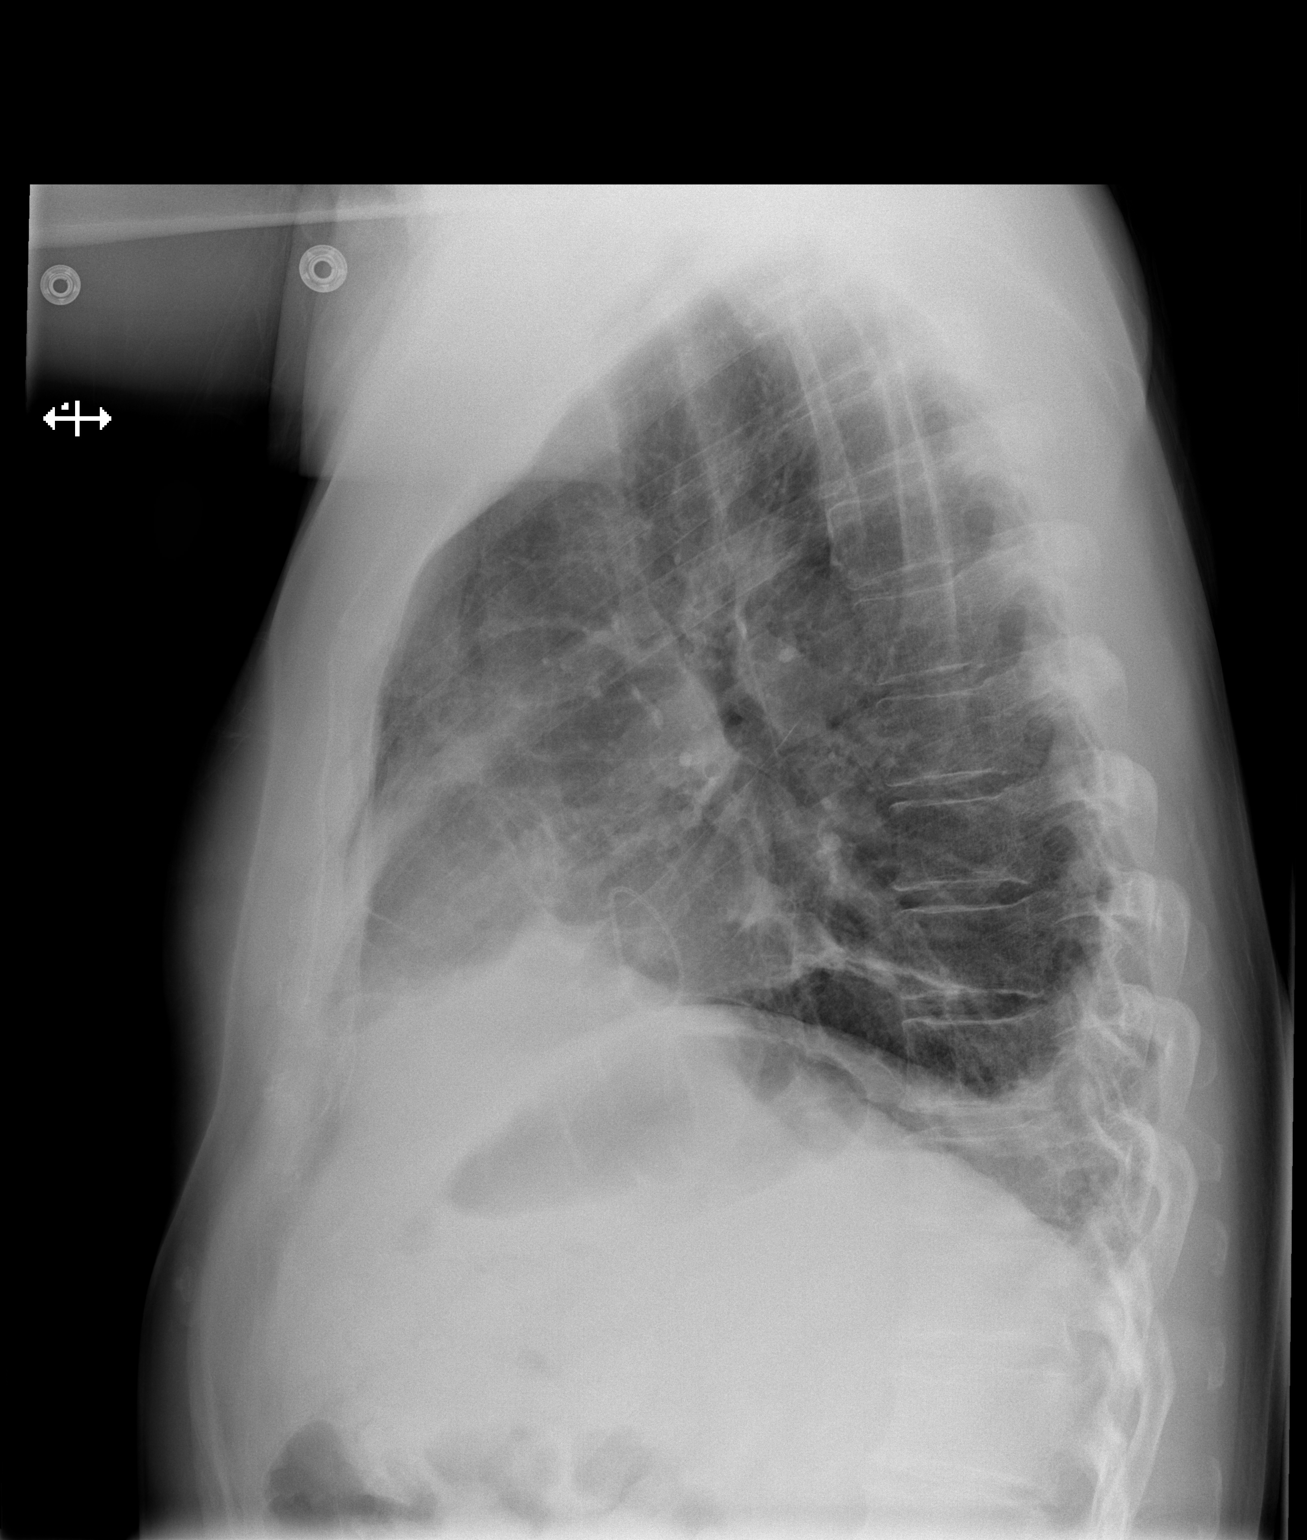

[2 of 2 positions shown; findings below may reference images not displayed]

FINDINGS: Stable enlarged cardiac silhouette.  There is bibasilar
linear atelectasis which is similar to prior.  No pulmonary edema.
Trace pleural effusions are noted.
IMPRESSION: 1..  No significant change.
2.  Bibasilar atelectasis and small effusions.]

## 2012-11-23 IMAGING — CR DG CHEST 2V
2 series · 2 of 2 positions shown · non-contrast
Comparison: 02/25/2012

CLINICAL DATA: Cough.  Mitral valve disorder.  Hypertension.
Shortness of breath.

CHEST - 2 VIEW

[w chest pa]
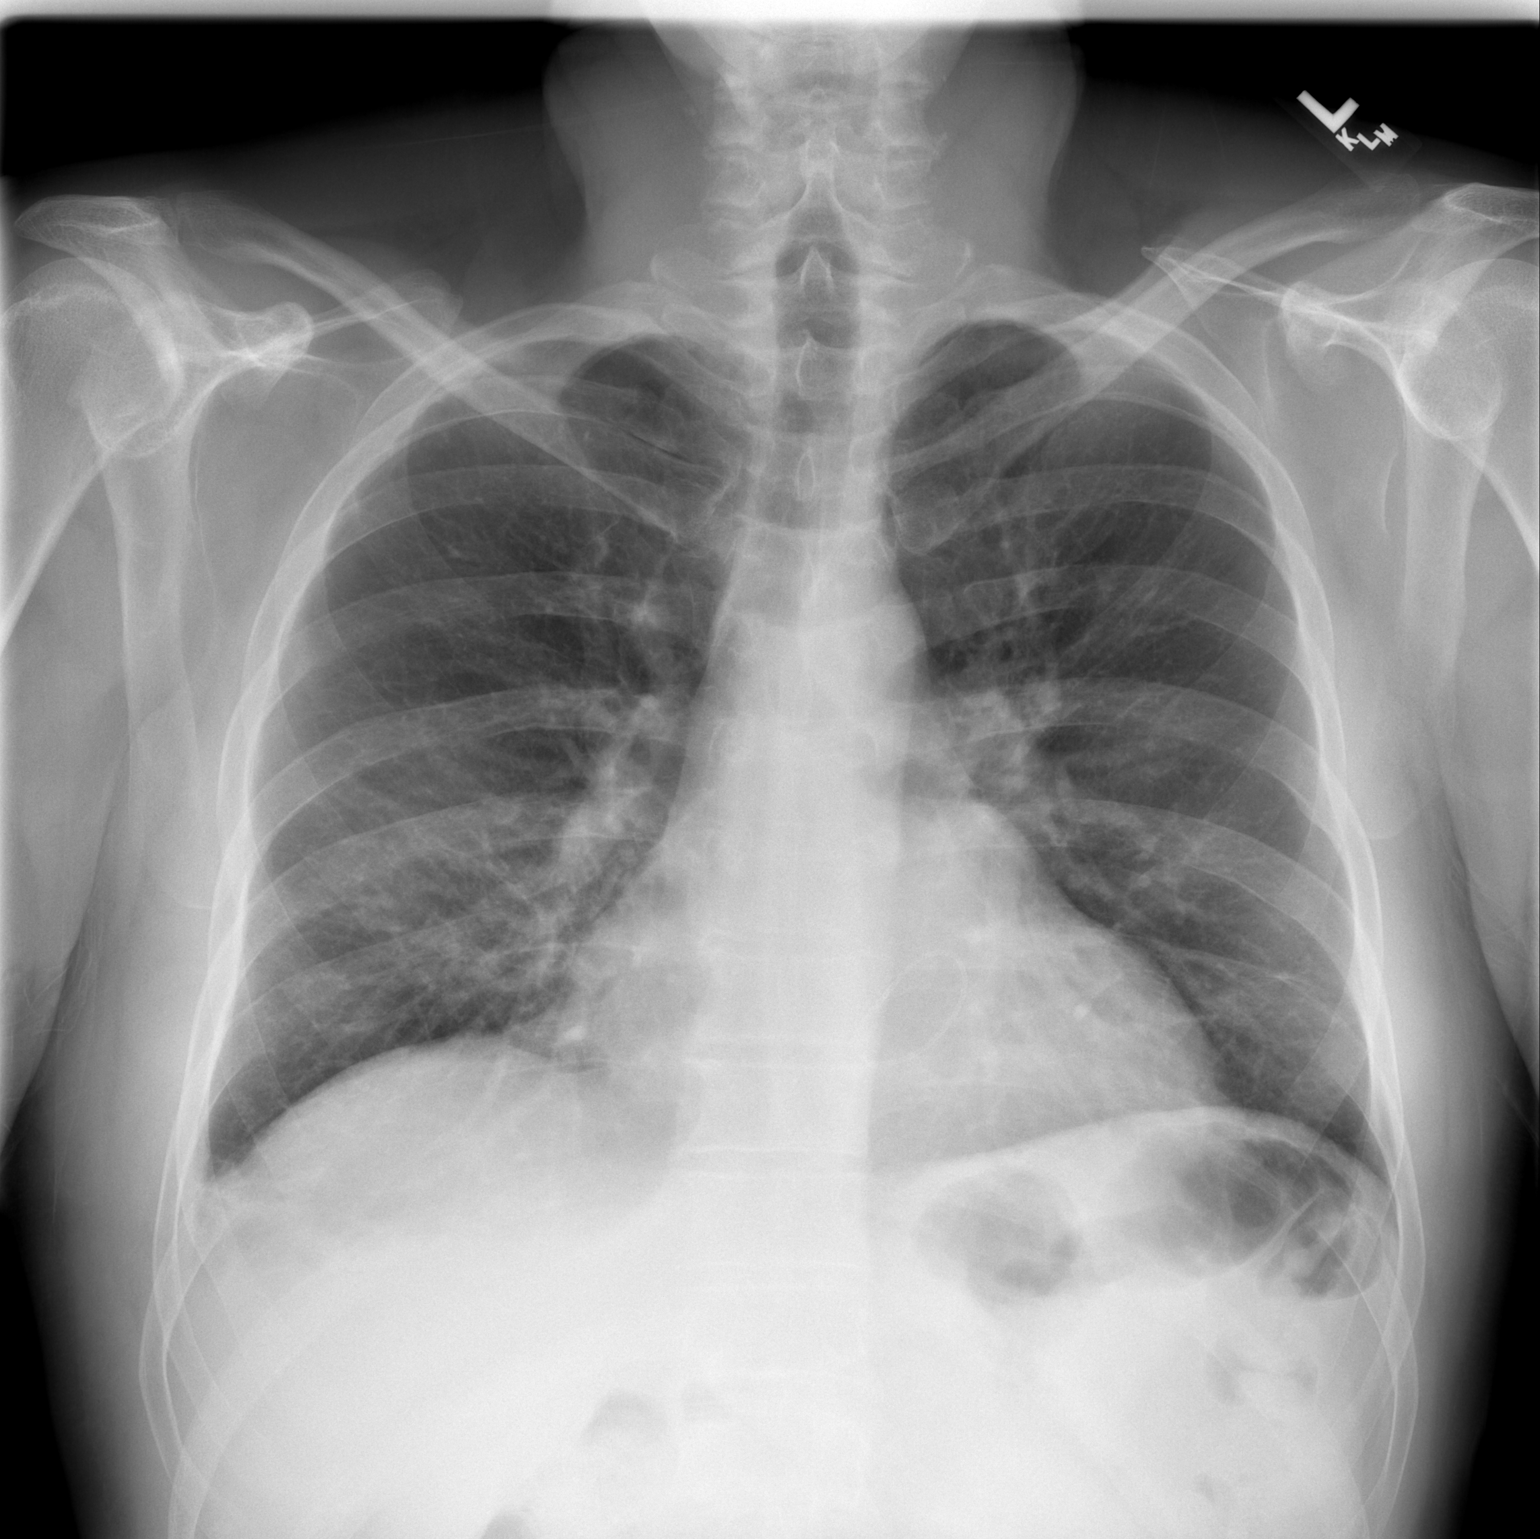

[w chest lat]
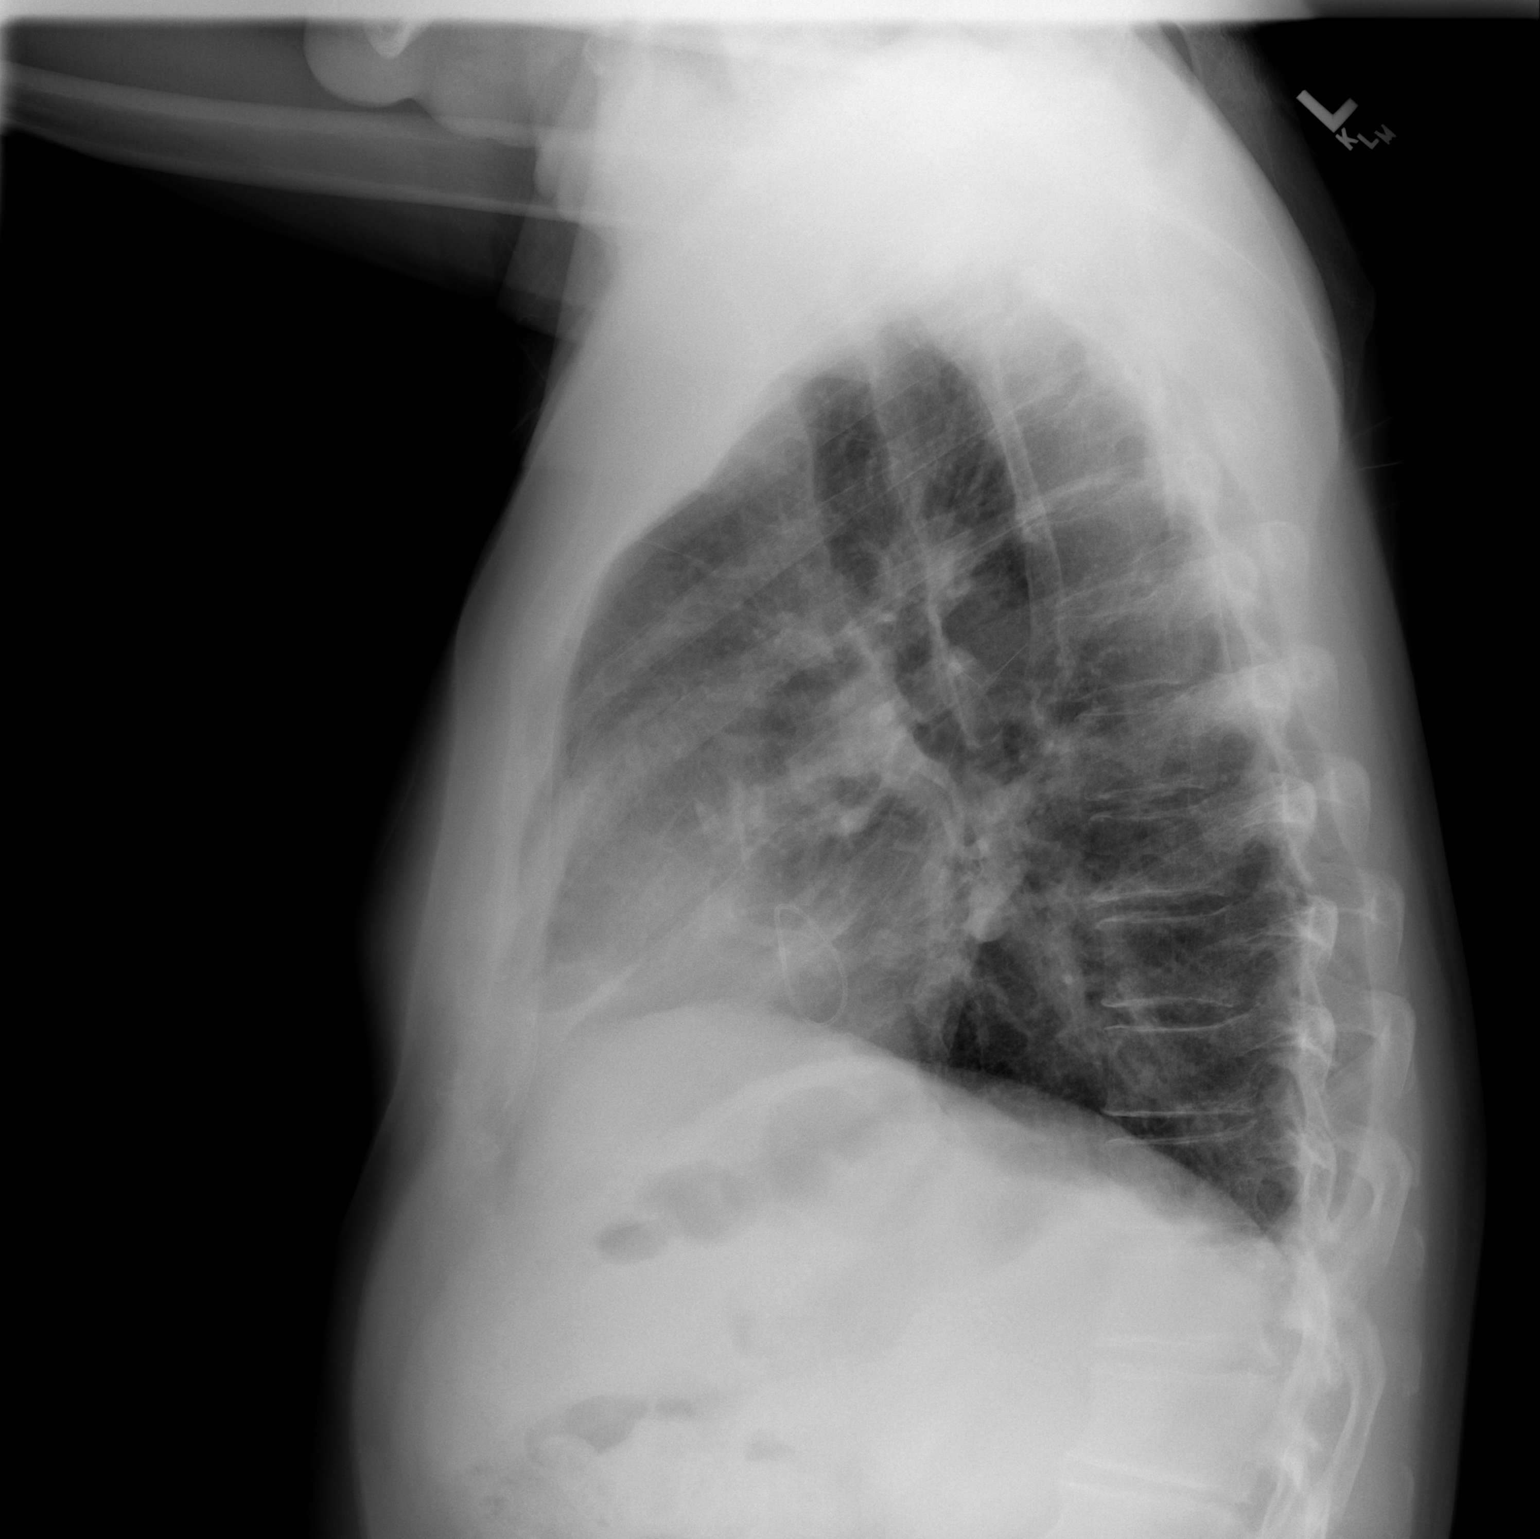

[2 of 2 positions shown; findings below may reference images not displayed]

FINDINGS: Left basilar atelectasis has cleared and right basilar
atelectasis is improved compared the prior exam.  The small pleural
effusions have resolved.

Mitral valve prosthesis noted.  Mildly low lung volumes are
present, along with mild cardiomegaly.  No edema noted.
IMPRESSION: 1.  Resolved left basilar atelectasis and resolved pleural
effusions.  Minimal residual right basilar atelectasis or scarring.
2.  Mild cardiomegaly, without edema.

## 2012-11-30 ENCOUNTER — Ambulatory Visit: Payer: BC Managed Care – PPO | Admitting: Thoracic Surgery (Cardiothoracic Vascular Surgery)

## 2012-12-03 ENCOUNTER — Ambulatory Visit: Payer: BC Managed Care – PPO | Admitting: Thoracic Surgery (Cardiothoracic Vascular Surgery)

## 2012-12-17 ENCOUNTER — Ambulatory Visit: Payer: BC Managed Care – PPO | Admitting: Thoracic Surgery (Cardiothoracic Vascular Surgery)

## 2013-03-25 ENCOUNTER — Telehealth: Payer: Self-pay | Admitting: *Deleted

## 2013-03-25 ENCOUNTER — Encounter: Payer: Self-pay | Admitting: Cardiology

## 2013-03-25 ENCOUNTER — Ambulatory Visit (INDEPENDENT_AMBULATORY_CARE_PROVIDER_SITE_OTHER): Payer: BC Managed Care – PPO | Admitting: Cardiology

## 2013-03-25 VITALS — BP 128/82 | HR 61 | Ht 71.5 in | Wt 195.3 lb

## 2013-03-25 DIAGNOSIS — R5383 Other fatigue: Secondary | ICD-10-CM

## 2013-03-25 DIAGNOSIS — R5381 Other malaise: Secondary | ICD-10-CM

## 2013-03-25 DIAGNOSIS — I4892 Unspecified atrial flutter: Secondary | ICD-10-CM

## 2013-03-25 DIAGNOSIS — E785 Hyperlipidemia, unspecified: Secondary | ICD-10-CM

## 2013-03-25 DIAGNOSIS — Z9889 Other specified postprocedural states: Secondary | ICD-10-CM

## 2013-03-25 NOTE — Assessment & Plan Note (Signed)
By cath 02/06/12

## 2013-03-25 NOTE — Assessment & Plan Note (Signed)
S/P DCCV July 2013. He was on Coumadin and Amiodarone but these have been stopped. He has been holding NSR per his history.

## 2013-03-25 NOTE — Patient Instructions (Signed)
Notify us if you have any trouble with irregular heart rhythm. See Dr Allyson Sabal in 6 months.

## 2013-03-25 NOTE — Assessment & Plan Note (Signed)
On statin. He is due for LFTs/Lipids

## 2013-03-25 NOTE — Assessment & Plan Note (Signed)
Done 02/21/12 by Dr Cornelius Moras. Echo done 12/13 looked good.

## 2013-03-25 NOTE — Assessment & Plan Note (Signed)
The pt says he was told by a urologist that he has low testosterone and is considering treatment options.

## 2013-03-25 NOTE — Telephone Encounter (Signed)
I called patient to let him know that [facility] wants him to have lipids, tsh, and liver drawn.  Patient said that his primary MD drew bloodwork recently.  I called Dr Jinny Sanders office (primary MD) to get labs.  Patient had all wanted labs drawn,but they would not send them to me without a signed release.  Patient has already left the office.  Franky Macho made aware of the situation.

## 2013-03-25 NOTE — Progress Notes (Signed)
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 He has a history of MR and is status post minimally-invasive mitral valve repair with annuloplasty and quadrangular resection along with reimplantation of his chordae by Dr. Ashley Mariner, Feb 21, 2012. Followup echocardiogram while he was in atrial flutter revealed no MR with EF of 40%. He had atrial flutter and was cardioverted  in July 2013 to sinus rhythm. He was treated with Amiodarone till December 2013. His Coumadin was stopped Aug 2013. He has been holding NSR. Follow up echo in December 2013 reveled improvement in his EF to 55-60% compared to 40% when in AF. stopped his Coumadin anticoagulation a month later.              He is seen today as a 6 month follow up. He has been doing well since we saw him last. He denies any SOB or palpitations. He does have some general fatigue and has had this complaint on prior office visits. He apparently saw a urologist and was told he has low testosterone. He is considering Rx. From a cardiac standpoint there should be no contraindications. He had normal coronaries at cath in April 2013.   Current Outpatient Prescriptions  Medication Sig Dispense Refill  . chlorpheniramine (CHLOR-TRIMETON) 4 MG tablet Take 8 mg by mouth every morning.       Marland Kitchen CRESTOR 10 MG tablet Take 1 tablet by mouth daily.      Marland Kitchen docusate sodium (COLACE) 100 MG capsule Take 100 mg by mouth daily.      Marland Kitchen ibuprofen (ADVIL,MOTRIN) 200 MG tablet Take 200 mg by mouth as needed.      Marland Kitchen levothyroxine (SYNTHROID, LEVOTHROID) 125 MCG tablet Take 125 mcg by mouth daily.      . nebivolol (BYSTOLIC) 10 MG tablet Take 10 mg by mouth daily.      . valsartan (DIOVAN) 320 MG tablet Take 160 mg by mouth daily.        No current  facility-administered medications for this visit.    Allergies  Allergen Reactions  . Penicillins Other (See Comments)    Unknown reaction.  Mother told him he was allergic.    History   Social History  . Marital Status: Married    Spouse Name: N/A    Number of Children: N/A  . Years of Education: N/A   Occupational History  . Not on file.   Social History Main Topics  . Smoking status: Never Smoker   . Smokeless tobacco: Never Used  . Alcohol Use: No  . Drug Use: No  . Sexually Active:    Other Topics Concern  . Not on file   Social History Narrative  . No narrative on file     Review of Systems: General: negative for chills, fever, night sweats or weight changes.  Cardiovascular: negative for chest pain, dyspnea on exertion, edema, orthopnea, palpitations, paroxysmal nocturnal dyspnea or shortness of breath Dermatological: negative for rash Respiratory: negative for cough or wheezing Urologic: negative for hematuria Abdominal: negative for nausea, vomiting, diarrhea, bright red blood per rectum, melena, or hematemesis Neurologic: negative for visual changes, syncope, or dizziness All other systems reviewed and are otherwise negative except as noted above.    Blood pressure 128/82, pulse 61,  height 5' 11.5" (1.816 m), weight 195 lb 4.8 oz (88.587 kg).  General appearance: alert, cooperative and no distress Neck: no carotid bruit and no JVD Lungs: clear to auscultation bilaterally Heart: regular rate and rhythm, S1, S2 normal, no murmur, click, rub or gallop  EKG  EKG: normal EKG, normal sinus rhythm, unchanged from previous tracings.  ASSESSMENT AND PLAN:   S/P mitral valve repair Done 02/21/12 by Dr Cornelius Moras. Echo done 12/13 looked good.  Normal coronary angiogram By cath 02/06/12  Atrial flutter S/P DCCV July 2013. He was on Coumadin and Amiodarone but these have been stopped. He has been holding NSR per his history.  Dyslipidemia On statin. He is due for  LFTs/Lipids  Fatigue The pt says he was told by a urologist that he has low testosterone and is considering treatment options.   PLAN  He is due for follow up lipids and LFTs. I will suggest he also get a TSH if he has not had this already. We can see him in 6 months.   Tashay Bozich KPA-C 03/25/2013 9:40 AM

## 2013-05-06 ENCOUNTER — Other Ambulatory Visit: Payer: Self-pay | Admitting: *Deleted

## 2013-07-01 ENCOUNTER — Other Ambulatory Visit: Payer: Self-pay | Admitting: *Deleted

## 2013-07-01 MED ORDER — VALSARTAN 320 MG PO TABS: 160.0000 mg | ORAL_TABLET | Freq: Every day | ORAL | Status: AC

## 2013-10-14 ENCOUNTER — Encounter: Payer: Self-pay | Admitting: Cardiovascular Disease

## 2013-10-14 ENCOUNTER — Ambulatory Visit (INDEPENDENT_AMBULATORY_CARE_PROVIDER_SITE_OTHER): Payer: BC Managed Care – PPO | Admitting: Cardiovascular Disease

## 2013-10-14 VITALS — BP 130/82 | HR 70 | Ht 71.5 in | Wt 205.0 lb

## 2013-10-14 DIAGNOSIS — Z79899 Other long term (current) drug therapy: Secondary | ICD-10-CM

## 2013-10-14 DIAGNOSIS — Z9889 Other specified postprocedural states: Secondary | ICD-10-CM

## 2013-10-14 DIAGNOSIS — I10 Essential (primary) hypertension: Secondary | ICD-10-CM

## 2013-10-14 DIAGNOSIS — E785 Hyperlipidemia, unspecified: Secondary | ICD-10-CM

## 2013-10-14 DIAGNOSIS — I4892 Unspecified atrial flutter: Secondary | ICD-10-CM

## 2013-10-14 NOTE — Assessment & Plan Note (Signed)
Controlled on current medications 

## 2013-10-14 NOTE — Patient Instructions (Signed)
  We will see you back in follow up in 1 year with Dr Berry.  Dr Berry has ordered an echocardiogram and blood work.   

## 2013-10-14 NOTE — Assessment & Plan Note (Signed)
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 Cornelius Moras . His last echo performed 09/25/12 revealed normal LV systolic function without evidence of regurgitation. He is asymptomatic.

## 2013-10-14 NOTE — Assessment & Plan Note (Signed)
On statin therapy. We will recheck a lipid profile

## 2013-10-14 NOTE — Progress Notes (Signed)
10/14/2013 Matthew Mclean   [birth year]  [id number]  Primary Physician MatthewJAMES W, MD Primary Cardiologist: Runell Gess MD Matthew Mclean   HPI: The patient is a 57 year old mildly overweight married Caucasian male, father of 1 child, who I last saw in the office 5 months ago. He is status post minimally-invasive mitral valve repair with annuloplasty and quadrangular resection along with reimplantation of his chordae by Dr. Ashley Mclean, Feb 21, 2012, for severe MR. Followup echocardiogram while he was in atrial flutter revealed no MR with EF of 40%. I ultimately cardioverted him on July 9 after 4 shocks to sinus rhythm, and stopped his Coumadin anticoagulation a month later. His other problems include hypertension and hyperlipidemia. He does feel somewhat fatigued.since I saw him he ago he denies chest pain or shortness of breath. He has had a knee injury which has resulted in him copy of exercise but subsequently came 10 pounds. He does complain of fatigue but denies chest pain, shortness of breath or palpitations.    Current Outpatient Prescriptions  Medication Sig Dispense Refill  . ANDROGEL PUMP 20.25 MG/ACT (1.62%) GEL 2 pumps daily      . chlorpheniramine (CHLOR-TRIMETON) 4 MG tablet Take 8 mg by mouth every morning.       Marland Kitchen CRESTOR 10 MG tablet Take 1 tablet by mouth daily.      Marland Kitchen docusate sodium (COLACE) 100 MG capsule Take 100 mg by mouth daily.      Marland Kitchen ibuprofen (ADVIL,MOTRIN) 200 MG tablet Take 200 mg by mouth as needed.      Marland Kitchen levothyroxine (SYNTHROID, LEVOTHROID) 125 MCG tablet Take 125 mcg by mouth daily.      . nebivolol (BYSTOLIC) 10 MG tablet Take 10 mg by mouth daily.      . valsartan (DIOVAN) 320 MG tablet Take 0.5 tablets (160 mg total) by mouth daily.  15 tablet  11   No current facility-administered medications for this visit.    Allergies  Allergen Reactions  . Penicillins Other (See Comments)    Unknown reaction.  Mother told him he was allergic.      History   Social History  . Marital Status: Married    Spouse Name: N/A    Number of Children: N/A  . Years of Education: N/A   Occupational History  . Not on file.   Social History Main Topics  . Smoking status: Never Smoker   . Smokeless tobacco: Never Used  . Alcohol Use: No  . Drug Use: No  . Sexual Activity:    Other Topics Concern  . Not on file   Social History Narrative  . No narrative on file     Review of Systems: General: negative for chills, fever, night sweats or weight changes.  Cardiovascular: negative for chest pain, dyspnea on exertion, edema, orthopnea, palpitations, paroxysmal nocturnal dyspnea or shortness of breath Dermatological: negative for rash Respiratory: negative for cough or wheezing Urologic: negative for hematuria Abdominal: negative for nausea, vomiting, diarrhea, bright red blood per rectum, melena, or hematemesis Neurologic: negative for visual changes, syncope, or dizziness All other systems reviewed and are otherwise negative except as noted above.    Blood pressure 130/82, pulse 70, height 5' 11.5" (1.816 m), weight 205 lb (92.987 kg).  General appearance: alert and no distress Neck: no adenopathy, no carotid bruit, no JVD, supple, symmetrical, trachea midline and thyroid not enlarged, symmetric, no tenderness/mass/nodules Lungs: clear to auscultation bilaterally Heart: regular rate and rhythm, S1,  S2 normal, no murmur, click, rub or gallop Extremities: extremities normal, atraumatic, no cyanosis or edema  EKG sinus rhythm at 70 without ST or T wave changes  ASSESSMENT AND PLAN:   Status post mitral valve repair The patient had severe mitral regurgitation with prolapse underwent minimally invasive mitral valve repair with annuloplasty and quadrangular resection along with 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 Cornelius Mclean . His last echo performed 09/25/12 revealed normal LV systolic function without evidence of  regurgitation. He is asymptomatic.  HTN (hypertension) Controlled on current medications  Dyslipidemia On statin therapy. We will recheck a lipid profile      Runell Gess MD [facility] 10/14/2013 2:27 PM

## 2013-10-28 ENCOUNTER — Ambulatory Visit (HOSPITAL_COMMUNITY)
Admission: RE | Admit: 2013-10-28 | Discharge: 2013-10-28 | Disposition: A | Discharge status: Home / Self Care | Payer: BC Managed Care – PPO | Source: Ambulatory Visit | Attending: Cardiology | Admitting: Cardiology

## 2013-10-28 DIAGNOSIS — I4892 Unspecified atrial flutter: Secondary | ICD-10-CM | POA: Insufficient documentation

## 2013-10-28 DIAGNOSIS — I059 Rheumatic mitral valve disease, unspecified: Secondary | ICD-10-CM

## 2013-10-28 DIAGNOSIS — Z9889 Other specified postprocedural states: Secondary | ICD-10-CM

## 2013-10-28 DIAGNOSIS — E785 Hyperlipidemia, unspecified: Secondary | ICD-10-CM | POA: Insufficient documentation

## 2013-10-28 DIAGNOSIS — Z954 Presence of other heart-valve replacement: Secondary | ICD-10-CM | POA: Insufficient documentation

## 2013-10-28 DIAGNOSIS — I10 Essential (primary) hypertension: Secondary | ICD-10-CM | POA: Insufficient documentation

## 2013-10-28 NOTE — Progress Notes (Signed)
2D Echo Performed 10/28/2013    Grabiel Schmutz, RCS  

## 2013-10-30 ENCOUNTER — Other Ambulatory Visit: Payer: Self-pay | Admitting: *Deleted

## 2013-10-30 ENCOUNTER — Encounter: Payer: Self-pay | Admitting: *Deleted

## 2013-10-30 MED ORDER — NEBIVOLOL HCL 10 MG PO TABS: 10.0000 mg | ORAL_TABLET | Freq: Every day | ORAL | Status: AC

## 2013-10-30 MED ORDER — ROSUVASTATIN CALCIUM 10 MG PO TABS: 10.0000 mg | ORAL_TABLET | Freq: Every day | ORAL | Status: AC

## 2014-09-25 ENCOUNTER — Encounter (HOSPITAL_COMMUNITY): Payer: Self-pay | Admitting: Cardiovascular Disease
